# Patient Record
Sex: Female | Born: 1980
Health system: Southern US, Community
[De-identification: ages and names within clinical notes are randomized; demographics above are authoritative.]

## PROBLEM LIST (undated history)

## (undated) ENCOUNTER — Emergency Department (HOSPITAL_BASED_OUTPATIENT_CLINIC_OR_DEPARTMENT_OTHER): Admission: EM | Payer: 59

## (undated) ENCOUNTER — Inpatient Hospital Stay (HOSPITAL_COMMUNITY): Payer: Self-pay

## (undated) DIAGNOSIS — Z8619 Personal history of other infectious and parasitic diseases: Secondary | ICD-10-CM

## (undated) DIAGNOSIS — O34219 Maternal care for unspecified type scar from previous cesarean delivery: Secondary | ICD-10-CM

## (undated) DIAGNOSIS — T7840XA Allergy, unspecified, initial encounter: Secondary | ICD-10-CM

## (undated) HISTORY — DX: Personal history of other infectious and parasitic diseases: Z86.19

## (undated) HISTORY — DX: Allergy, unspecified, initial encounter: T78.40XA

---

## 2012-05-19 ENCOUNTER — Encounter: Payer: Medicaid Other | Admitting: Medical

## 2012-06-03 ENCOUNTER — Ambulatory Visit: Payer: Medicaid Other | Attending: Family Medicine | Admitting: Family Medicine

## 2012-06-03 VITALS — BP 98/66 | HR 59 | Temp 97.9°F | Resp 18 | Ht 65.0 in | Wt 147.0 lb

## 2012-06-03 DIAGNOSIS — L659 Nonscarring hair loss, unspecified: Secondary | ICD-10-CM | POA: Insufficient documentation

## 2012-06-03 DIAGNOSIS — J309 Allergic rhinitis, unspecified: Secondary | ICD-10-CM

## 2012-06-03 MED ORDER — LORATADINE 10 MG PO TABS: 10.0000 mg | ORAL_TABLET | Freq: Every day | ORAL | Status: DC

## 2012-06-03 MED ORDER — FLUTICASONE PROPIONATE 50 MCG/ACT NA SUSP: 2.0000 | Freq: Every day | NASAL | Status: DC

## 2012-06-03 NOTE — Progress Notes (Signed)
Patient states that she has been loosing a lot of hair while in the shower. Denies fever/chills, nausea. States maternal history of Thyroid disease.

## 2012-06-03 NOTE — Patient Instructions (Addendum)
Alopecia Areata Alopecia areata is a self-destructing (autoimmune) disease that results in the loss of hair. In this condition your body's immune system attacks the hair follicle. The hair follicle is responsible for growing hair. Hair loss can occur on the scalp and other parts of the body. It usually starts as one or more small, round, smooth patches of hair loss. It occurs in males and females of all ages and races, but usually starts before age 30. The scalp is the most commonly affected area, but the beard or any hair-bearing site can be affected. This type of hair loss does not leave scars where the hair was lost.  Many people with alopecia areata only have a few patches of hair loss. In others, extensive patchy hair loss occurs. In a few people, all scalp hair is lost (alopecia totalis), or hair is lost from the entire scalp and body (alopecia universalis). No matter how widespread the hair loss, the hair follicles remain alive and are ready to resume normal hair production whenever they receive the correct signal. Hair re-growth may occur without treatment and can even restart after years of hair loss.  CAUSES  It is thought that something triggers the immune system to stop hair growth. It is not always known what the cause is. Some people have genetic markers that can increase the chance of developing alopecia areata. Alopecia areata often occurs in families whose members have had:  Asthma.  Hay fever.  Atopic eczema.  Some autoimmune diseases may also be a trigger, such as:  Thyroid disease.  Diabetes.  Rheumatoid arthritis.  Lupus erythematosus.  Vitiligo.  Pernicious anemia.  Odom's disease. OTHER SYMPTOMS In some people, the nail beds may develop rows of tiny dents (stippling) or the nail beds can become distorted. Other than the hair and nail beds, no other body part is affected.  PROGNOSIS  Alopecia areata is not medically disabling. People with alopecia areata are  usually in excellent health. Hair loss can be emotionally difficult. The National Alopecia Areata Foundation has resources available to help individuals and families with alopecia areata. Their goal is to help people with the condition live full, productive lives. There are many successful, well-adjusted, contented people living with Alopecia areata. Alopecia areata can be overcome with:  A positive self image.  Sound medical facts.  The support of others, such as:  Sometimes professional counseling is helpful to develop one's self-confidence and positive self-image. TREATMENT  There is no cure for alopecia areata. There are several available treatments. Treatments are most effective in milder cases. No treatment is effective for everyone. Choice of treatment depends mainly on a person's age and the extent of their hair loss. Alopecia areata occurs in two forms:   A mild patchy form where less than 50 percent of scalp hair is lost.  An extensive form where greater than 50 percent of scalp hair is lost. These two forms of alopecia areata behave quite differently, and the choice of treatment depends on which form is present. Current treatments do not turn alopecia areata off. They can stimulate the hair follicle to produce hair.  Some medications used to treat mild cases include:  Cortisone injections. The most common treatment is the injection of cortisone into the bare skin patches. The injections are usually given by a caregiver specializing in skin issues (dermatologist). This caregiver will use a tiny needle to give multiple injections into the skin in and around the bare patches. The injections are repeated once a month.   If new hair growth occurs, it is usually visible within 4 weeks. Treatment does not prevent new patches of hair loss from developing. There are few side effects from local cortisone injections. Occasionally, temporary dents (depressions) in the skin result from the local  injections, but these dents can fill in by themselves.  Topical minoxidil. Five percent topical minoxidil solution applied twice daily may grow hair in alopecia areata. Scalp, eyebrows, and beard hair may respond. If scalp hair re-grows completely, treatment can be stopped. Response may improve if topical cortisone cream is applied 30 minutes after the minoxidil. Topical minoxidil is safe, easy to use, and does not lower blood pressure in persons with normal blood pressure. Minoxidil can lead to unwanted facial hair growth in some people.  Anthralin cream or ointment. Another treatment is the application of anthralin cream or ointment. Anthralin is a tar-like substance that has been used widely for psoriasis. Anthralin is applied to the bare patches once daily. It is washed off after a short time, usually 30 to 60 minutes later. If new hair growth occurs, it is seen in 8 to 12 weeks. Anthralin can be irritating to the skin. It can cause temporary, brownish discoloration of the treated skin. By using short treatment times, skin irritation and skin staining are reduced without decreasing effectiveness. Care must be taken not to get anthralin in the eyes. Some of the medications used for more extensive cases where there is greater than 50% hair loss include:  Cortisone pills. Cortisone pills are sometimes given for extensive scalp hair loss. Cortisone taken internally is much stronger than local injections of cortisone into the skin. It is necessary to discuss possible side effects of cortisone pills with your caregiver. In general, however, cortisone pills are used in relatively few patients with alopecia areata due to health risks from prolonged use. Also, hair that has grown is likely to fall out when the cortisone pills are stopped.  Topical minoxidil. See previous explanation under mild, patchy alopecia areata. However, minoxidil is not effective in total loss of scalp hair (alopecia totalis).  Topical  immunotherapy. Another method of treating alopecia areata or alopecia totalis/universalis involves producing an allergic rash or allergic contact dermatitis. Chemicals such as diphencyprone (DPCP) or squaric acid dibutyl ester (SADBE) are applied to the scalp to produce an allergic rash which resembles poison oak or ivy. Approximately 40% of patients treated with topical immunotherapy will re-grow scalp hair after about 6 months of treatment. Those who do successfully re-grow scalp hair will need to continue treatment to maintain hair re-growth.  Wigs. For extensive hair loss, a wig can be an important option for some people. Proper attention will make a quality wig look completely natural. A wig will need to be cut, thinned, and styled. To keep a net base wig from falling off, special double-sided tape can be purchased in beauty supply outlets and fastened to the inside of the wig.  For those with completely bare heads, there are suction caps to which any wig can be attached. There are also entire suction cap wig units. FOR MORE INFORMATION National Alopecia Areata Foundation: [email or web address] Document Released: 08/10/2003 Document Revised: 03/30/2011 Document Reviewed: 03/13/2008 [facility] Patient Information 2013 [place], [place].  Allergic Rhinitis Allergic rhinitis is when the mucous membranes in the nose respond to allergens. Allergens are particles in the air that cause your body to have an allergic reaction. This causes you to release allergic antibodies. Through a chain of events, these eventually cause you to release  histamine into the blood stream (hence the use of antihistamines). Although meant to be protective to the body, it is this release that causes your discomfort, such as frequent sneezing, congestion and an itchy runny nose.  CAUSES  The pollen allergens may come from grasses, trees, and weeds. This is seasonal allergic rhinitis, or "hay fever." Other allergens cause year-round allergic  rhinitis (perennial allergic rhinitis) such as house dust mite allergen, pet dander and mold spores.  SYMPTOMS   Nasal stuffiness (congestion).  Runny, itchy nose with sneezing and tearing of the eyes.  There is often an itching of the mouth, eyes and ears. It cannot be cured, but it can be controlled with medications. DIAGNOSIS  If you are unable to determine the offending allergen, skin or blood testing may find it. TREATMENT   Avoid the allergen.  Medications and allergy shots (immunotherapy) can help.  Hay fever may often be treated with antihistamines in pill or nasal spray forms. Antihistamines block the effects of histamine. There are over-the-counter medicines that may help with nasal congestion and swelling around the eyes. Check with your caregiver before taking or giving this medicine. If the treatment above does not work, there are many new medications your caregiver can prescribe. Stronger medications may be used if initial measures are ineffective. Desensitizing injections can be used if medications and avoidance fails. Desensitization is when a patient is given ongoing shots until the body becomes less sensitive to the allergen. Make sure you follow up with your caregiver if problems continue. SEEK MEDICAL CARE IF:   You develop fever (more than 100.5 F (38.1 C).  You develop a cough that does not stop easily (persistent).  You have shortness of breath.  You start wheezing.  Symptoms interfere with normal daily activities. Document Released: 09/30/2000 Document Revised: 03/30/2011 Document Reviewed: 04/11/2008 [facility] Patient Information 2013 [place], [place].

## 2012-06-03 NOTE — Progress Notes (Signed)
Patient here to establish care Complains of not beeing able to breather through nose at night time Has been having some hair loss

## 2012-06-03 NOTE — Progress Notes (Signed)
Patient ID: Kelly Harvey, female   DOB: 1980/09/11, 32 y.o.   MRN: [id number] CC: alopecia and nasal congestion  Translator used to communicate with patient.  HPI: Pt complaining of nasal allergies and sneezing worse in the spring time.  She is also concerned about some mild hair loss that she has noticed.    No Known Allergies No past medical history on file. No current outpatient prescriptions on file prior to visit.   No current facility-administered medications on file prior to visit.   No family history on file. History   Social History  . Marital Status: Married    Spouse Name: N/A    Number of Children: N/A  . Years of Education: N/A   Occupational History  . Not on file.   Social History Main Topics  . Smoking status: Never Smoker   . Smokeless tobacco: Not on file  . Alcohol Use: Not on file  . Drug Use: Not on file  . Sexually Active: Not on file   Other Topics Concern  . Not on file   Social History Narrative  . No narrative on file    Review of Systems  Constitutional: Negative for fever, chills, diaphoresis, activity change, appetite change and fatigue.  HENT: nasal congestion, sneezng and watery eyes   Eyes: Negative for pain, discharge, redness, itching and visual disturbance.  Respiratory: Negative for cough, choking, chest tightness, shortness of breath, wheezing and stridor.   Cardiovascular: Negative for chest pain, palpitations and leg swelling.  Gastrointestinal: Negative for abdominal distention.  Genitourinary: Negative for dysuria, urgency, frequency, hematuria, flank pain, decreased urine volume, difficulty urinating and dyspareunia.  Musculoskeletal: Negative for back pain, joint swelling, arthralgias and gait problem.  Neurological: Negative for dizziness, tremors, seizures, syncope, facial asymmetry, speech difficulty, weakness, light-headedness, numbness and headaches.  Hematological: Negative for adenopathy. Does not bruise/bleed easily.   Psychiatric/Behavioral: Negative for hallucinations, behavioral problems, confusion, dysphoric mood, decreased concentration and agitation.    Objective:   Filed Vitals:   06/03/12 1024  BP: 98/66  Pulse: 59  Temp: 97.9 F (36.6 C)  Resp: 18    Physical Exam  Constitutional: Appears well-developed and well-nourished. No distress. Pt wearing her traditional head coverings HENT: Normocephalic. External right and left ear normal. Oropharynx is clear and moist.  Eyes: Conjunctivae and EOM are normal. PERRLA, no scleral icterus.  Neck: Normal ROM. Neck supple. No JVD. No tracheal deviation. No thyromegaly.  CVS: RRR, S1/S2 +, no murmurs, no gallops, no carotid bruit.  Pulmonary: Effort and breath sounds normal, no stridor, rhonchi, wheezes, rales.  Abdominal: Soft. BS +,  no distension, tenderness, rebound or guarding.  Musculoskeletal: Normal range of motion. No edema and no tenderness.  Lymphadenopathy: No lymphadenopathy noted, cervical, inguinal. Neuro: Alert. Normal reflexes, muscle tone coordination. No cranial nerve deficit. Skin: Skin is warm and dry. No rash noted. Not diaphoretic. No erythema. No pallor.  Psychiatric: Normal mood and affect. Behavior, judgment, thought content normal.   No results found for this basename: WBC, HGB, HCT, MCV, PLT   No results found for this basename: CREATININE, BUN, NA, K, CL, CO2    No results found for this basename: HGBA1C        Assessment:  Allergic rhinitis  Alopecia        Plan:     claritin 10 mg po daily flonase NS daily Return if no improvement  Follow up for eval of her alopecia and to have TSH tested  Rodney Langton, MD,  CDE, FAAFP [facility] [place], [place]

## 2012-06-16 ENCOUNTER — Ambulatory Visit: Payer: Medicaid Other | Attending: Family Medicine | Admitting: Internal Medicine

## 2012-06-16 VITALS — BP 120/76 | HR 58 | Temp 98.7°F | Resp 16 | Ht 65.0 in | Wt 148.0 lb

## 2012-06-16 DIAGNOSIS — L659 Nonscarring hair loss, unspecified: Secondary | ICD-10-CM

## 2012-06-16 DIAGNOSIS — N979 Female infertility, unspecified: Secondary | ICD-10-CM | POA: Insufficient documentation

## 2012-06-16 NOTE — Progress Notes (Signed)
Patient states she would like a referral to [facility], ph# [phone] with Dr. Janae Sauce if possible.

## 2012-06-16 NOTE — Progress Notes (Signed)
Patient ID: Shauniece Harvey, female   DOB: Jun 29, 1980, 32 y.o.   MRN: [id number] Patient Demographics  Kelly Harvey, is a 32 y.o. female  ZHY:[id number]  [id number]:[id number]  DOB - 10/02/1980  Chief Complaint  Patient presents with  . Follow-up    To review lab results from last visit; patient states hair is falling out.        Subjective:   Kelly Harvey today is here for a follow up visit. She claims that she is losing hair, however has no patches of hair loss. She is also treated seeking treatment for infertility-Has been trying to get pregnant for the past 5 years.  Patient has No headache, No chest pain, No abdominal pain - No Nausea, No new weakness tingling or numbness, No Cough - SOB.   Objective:    Filed Vitals:   06/16/12 1602  BP: 120/76  Pulse: 58  Temp: 98.7 F (37.1 C)  TempSrc: Oral  Resp: 16  Height: 5\' 5"  (1.651 m)  Weight: 148 lb (67.132 kg)  SpO2: 99%     ALLERGIES:  No Known Allergies  PAST MEDICAL HISTORY: Past Medical History  Diagnosis Date  . Allergy     MEDICATIONS AT HOME: Prior to Admission medications   Medication Sig Start Date End Date Taking? Authorizing Provider  fluticasone (FLONASE) 50 MCG/ACT nasal spray Place 2 sprays into the nose daily. 06/03/12  Yes Kelly Cyndie Mull, MD  loratadine (CLARITIN) 10 MG tablet Take 1 tablet (10 mg total) by mouth daily. 06/03/12  Yes Kelly Cyndie Mull, MD     Exam  General appearance :Awake, alert, not in any distress. Speech Clear. Not toxic Looking HEENT: Atraumatic and Normocephalic, pupils equally reactive to light and accomodation Neck: supple, no JVD. No cervical lymphadenopathy.  Chest:Good air entry bilaterally, no added sounds  CVS: S1 S2 regular, no murmurs.  Abdomen: Bowel sounds present, Non tender and not distended with no gaurding, rigidity or rebound. Extremities: B/L Lower Ext shows no edema, both legs are warm to touch Neurology: Awake alert, and oriented X 3, CN II-XII intact, Non  focal Skin:No Rash Wounds:N/A    Data Review   CBC No results found for this basename: WBC, HGB, HCT, PLT, MCV, MCH, MCHC, RDW, NEUTRABS, LYMPHSABS, MONOABS, EOSABS, BASOSABS, BANDABS, BANDSABD,  in the last 168 hours  Chemistries   No results found for this basename: NA, K, CL, CO2, GLUCOSE, BUN, CREATININE, GFRCGP, CALCIUM, MG, AST, ALT, ALKPHOS, BILITOT,  in the last 168 hours ------------------------------------------------------------------------------------------------------------------ No results found for this basename: HGBA1C,  in the last 72 hours ------------------------------------------------------------------------------------------------------------------ No results found for this basename: CHOL, HDL, LDLCALC, TRIG, CHOLHDL, LDLDIRECT,  in the last 72 hours ------------------------------------------------------------------------------------------------------------------ No results found for this basename: TSH, T4TOTAL, FREET3, T3FREE, THYROIDAB,  in the last 72 hours ------------------------------------------------------------------------------------------------------------------ No results found for this basename: VITAMINB12, FOLATE, FERRITIN, TIBC, IRON, RETICCTPCT,  in the last 72 hours  Coagulation profile  No results found for this basename: INR, PROTIME,  in the last 168 hours    Assessment & Plan   Alopecia- Will check TSH, however feel that this is going to be negative. And she is going to be referred to [facility]- included a day he woke up which will include TSH testing   I explained the above to the patient, at this time she chooses to have her TSH tested at her appointment with her OB  Followup as needed

## 2013-03-13 ENCOUNTER — Ambulatory Visit (INDEPENDENT_AMBULATORY_CARE_PROVIDER_SITE_OTHER): Payer: BC Managed Care – PPO | Admitting: General Surgery

## 2013-03-13 ENCOUNTER — Encounter (INDEPENDENT_AMBULATORY_CARE_PROVIDER_SITE_OTHER): Payer: Self-pay | Admitting: General Surgery

## 2013-03-13 ENCOUNTER — Encounter (INDEPENDENT_AMBULATORY_CARE_PROVIDER_SITE_OTHER): Payer: Self-pay

## 2013-03-13 VITALS — BP 110/72 | HR 64 | Temp 98.2°F | Resp 14 | Ht 64.0 in | Wt 147.6 lb

## 2013-03-13 DIAGNOSIS — N6082 Other benign mammary dysplasias of left breast: Secondary | ICD-10-CM | POA: Insufficient documentation

## 2013-03-13 DIAGNOSIS — N6089 Other benign mammary dysplasias of unspecified breast: Secondary | ICD-10-CM

## 2013-03-13 NOTE — Patient Instructions (Signed)
Will get u/s of left breast  Shower daily with antibacterial soap

## 2013-03-13 NOTE — Progress Notes (Signed)
Patient ID: Kelly Harvey, female   DOB: 03/16/1980, 33 y.o.   MRN: [id number]  Chief Complaint  Patient presents with  . New Evaluation    eval seb cyst left br    HPI Kelly Harvey is a 33 y.o. female.  We're asked to see the patient in consultation by Dr. Melba Coon To evaluate her for a cyst on the left breast. The patient developed a lump in the upper inner left breast about 4 months ago. About 2 months ago she squeezed it and got a white oily substance out. The area became larger and red. She could warm compresses on it and squeezed it more and got more of the white we'll allow. Since that time it has pretty much disappeared. She denies any breast pain. She denies any fevers or chills. She's had no discharge from her nipple. HPI  Past Medical History  Diagnosis Date  . Allergy     History reviewed. No pertinent past surgical history.  History reviewed. No pertinent family history.  Social History History  Substance Use Topics  . Smoking status: Never Smoker   . Smokeless tobacco: Former Systems developer  . Alcohol Use: No    No Known Allergies  No current outpatient prescriptions on file.   No current facility-administered medications for this visit.    Review of Systems Review of Systems  Constitutional: Negative.   HENT: Negative.   Eyes: Negative.   Respiratory: Negative.   Cardiovascular: Negative.   Gastrointestinal: Negative.   Endocrine: Negative.   Genitourinary: Negative.   Musculoskeletal: Negative.   Skin: Negative.   Allergic/Immunologic: Negative.   Neurological: Negative.   Hematological: Negative.   Psychiatric/Behavioral: Negative.     Blood pressure 110/72, pulse 64, temperature 98.2 F (36.8 C), temperature source Oral, resp. rate 14, height 5\' 4"  (1.626 m), weight 147 lb 9.6 oz (66.951 kg).  Physical Exam Physical Exam  Constitutional: She is oriented to person, place, and time. She appears well-developed and well-nourished.  HENT:  Head: Normocephalic  and atraumatic.  Eyes: Conjunctivae and EOM are normal. Pupils are equal, round, and reactive to light.  Neck: Normal range of motion. Neck supple.  Cardiovascular: Normal rate, regular rhythm and normal heart sounds.   Pulmonary/Chest: Effort normal and breath sounds normal.  There is no palpable mass in either breast. There is no palpable axillary, supraclavicular, or cervical lymphadenopathy. There is only some very faint discoloration of the skin in the upper inner left breast  Abdominal: Soft. Bowel sounds are normal.  Musculoskeletal: Normal range of motion.  Lymphadenopathy:    She has no cervical adenopathy.  Neurological: She is alert and oriented to person, place, and time.  Skin: Skin is warm and dry.  Psychiatric: She has a normal mood and affect. Her behavior is normal.    Data Reviewed As above  Assessment    The patient appears to have a sebaceous cyst in the upper inner left breast that resolved once she was able to express the gland. For completeness I think it would  be reasonable to get an ultrasound of the left breast to make sure we are not missing anything. If this is negative then I think she can follow up as needed.     Plan    Plan for a left breast ultrasound and if negative when necessary followup        TOTH III,PAUL S 03/13/2013, 10:45 AM

## 2013-03-15 ENCOUNTER — Other Ambulatory Visit (INDEPENDENT_AMBULATORY_CARE_PROVIDER_SITE_OTHER): Payer: Self-pay | Admitting: General Surgery

## 2013-03-15 DIAGNOSIS — N6310 Unspecified lump in the right breast, unspecified quadrant: Secondary | ICD-10-CM

## 2013-03-24 ENCOUNTER — Other Ambulatory Visit: Payer: Self-pay | Admitting: *Deleted

## 2013-03-28 ENCOUNTER — Other Ambulatory Visit: Payer: Medicaid Other

## 2014-01-07 ENCOUNTER — Emergency Department (HOSPITAL_COMMUNITY)
Admission: EM | Admit: 2014-01-07 | Discharge: 2014-01-07 | Disposition: A | Discharge status: Home / Self Care | Payer: No Typology Code available for payment source | Attending: Emergency Medicine | Admitting: Emergency Medicine

## 2014-01-07 ENCOUNTER — Emergency Department (HOSPITAL_COMMUNITY): Payer: No Typology Code available for payment source

## 2014-01-07 ENCOUNTER — Encounter (HOSPITAL_COMMUNITY): Payer: Self-pay | Admitting: Emergency Medicine

## 2014-01-07 DIAGNOSIS — S299XXA Unspecified injury of thorax, initial encounter: Secondary | ICD-10-CM | POA: Insufficient documentation

## 2014-01-07 DIAGNOSIS — S0990XA Unspecified injury of head, initial encounter: Secondary | ICD-10-CM | POA: Diagnosis not present

## 2014-01-07 DIAGNOSIS — Y9259 Other trade areas as the place of occurrence of the external cause: Secondary | ICD-10-CM | POA: Diagnosis not present

## 2014-01-07 DIAGNOSIS — Y998 Other external cause status: Secondary | ICD-10-CM | POA: Diagnosis not present

## 2014-01-07 DIAGNOSIS — Y9389 Activity, other specified: Secondary | ICD-10-CM | POA: Diagnosis not present

## 2014-01-07 DIAGNOSIS — S199XXA Unspecified injury of neck, initial encounter: Secondary | ICD-10-CM | POA: Insufficient documentation

## 2014-01-07 DIAGNOSIS — S24109A Unspecified injury at unspecified level of thoracic spinal cord, initial encounter: Secondary | ICD-10-CM | POA: Diagnosis present

## 2014-01-07 DIAGNOSIS — R0789 Other chest pain: Secondary | ICD-10-CM

## 2014-01-07 DIAGNOSIS — M549 Dorsalgia, unspecified: Secondary | ICD-10-CM

## 2014-01-07 DIAGNOSIS — M542 Cervicalgia: Secondary | ICD-10-CM

## 2014-01-07 DIAGNOSIS — S4991XA Unspecified injury of right shoulder and upper arm, initial encounter: Secondary | ICD-10-CM | POA: Diagnosis not present

## 2014-01-07 MED ORDER — IBUPROFEN 200 MG PO TABS
600.0000 mg | ORAL_TABLET | Freq: Once | ORAL | Status: AC
  Administered 2014-01-07: 600 mg via ORAL
  Filled 2014-01-07: qty 3

## 2014-01-07 MED ORDER — IBUPROFEN 800 MG PO TABS: 800.0000 mg | ORAL_TABLET | Freq: Three times a day (TID) | ORAL | Status: DC | PRN

## 2014-01-07 MED ORDER — CYCLOBENZAPRINE HCL 10 MG PO TABS: 10.0000 mg | ORAL_TABLET | Freq: Three times a day (TID) | ORAL | Status: DC | PRN

## 2014-01-07 NOTE — Discharge Instructions (Signed)
Read the information below.  Use the prescribed medication as directed.  Please discuss all new medications with your pharmacist.  You may return to the Emergency Department at any time for worsening condition or any new symptoms that concern you.  If there is any possibility that you might be pregnant, please let your health care provider know and discuss this with the pharmacist to ensure medication safety.    You have had a head injury which does not appear to require admission at this time. A concussion is a state of changed mental ability from trauma. SEEK IMMEDIATE MEDICAL ATTENTION IF: There is confusion or drowsiness (although children frequently become drowsy after injury).  You cannot awaken the injured person.  There is nausea (feeling sick to your stomach) or continued, forceful vomiting.  You notice dizziness or unsteadiness which is getting worse, or inability to walk.  You have convulsions or unconsciousness.  You experience severe, persistent headaches not relieved by Tylenol?. (Do not take aspirin as this impairs clotting abilities). Take other pain medications only as directed.  You cannot use arms or legs normally.  There are changes in pupil sizes. (This is the black center in the colored part of the eye)  There is clear or bloody discharge from the nose or ears.  Change in speech, vision, swallowing, or understanding.  Localized weakness, numbness, tingling, or change in bowel or bladder control.
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
## 2016-01-27 LAB — OB RESULTS CONSOLE VARICELLA ZOSTER ANTIBODY, IGG: Varicella: IMMUNE

## 2016-01-27 LAB — OB RESULTS CONSOLE HEPATITIS B SURFACE ANTIGEN: Hepatitis B Surface Ag: NEGATIVE

## 2016-01-27 LAB — OB RESULTS CONSOLE RUBELLA ANTIBODY, IGM: RUBELLA: IMMUNE

## 2016-01-27 LAB — OB RESULTS CONSOLE ABO/RH: RH Type: POSITIVE

## 2016-01-27 LAB — OB RESULTS CONSOLE GC/CHLAMYDIA
Chlamydia: NEGATIVE
GC PROBE AMP, GENITAL: NEGATIVE

## 2016-01-27 LAB — OB RESULTS CONSOLE ANTIBODY SCREEN: Antibody Screen: NEGATIVE

## 2016-01-29 ENCOUNTER — Encounter: Payer: Medicaid Other | Admitting: Obstetrics & Gynecology

## 2016-05-08 LAB — OB RESULTS CONSOLE HIV ANTIBODY (ROUTINE TESTING): HIV: NONREACTIVE

## 2016-07-09 LAB — OB RESULTS CONSOLE GBS: STREP GROUP B AG: POSITIVE

## 2016-07-14 ENCOUNTER — Inpatient Hospital Stay (HOSPITAL_COMMUNITY)
Admission: AD | Admit: 2016-07-14 | Discharge: 2016-07-14 | Disposition: A | Discharge status: Home / Self Care | Payer: Medicaid Other | Source: Ambulatory Visit | Attending: Obstetrics and Gynecology | Admitting: Obstetrics and Gynecology

## 2016-07-14 ENCOUNTER — Encounter (HOSPITAL_COMMUNITY): Payer: Self-pay

## 2016-07-14 DIAGNOSIS — Z3A37 37 weeks gestation of pregnancy: Secondary | ICD-10-CM | POA: Diagnosis not present

## 2016-07-14 DIAGNOSIS — O479 False labor, unspecified: Secondary | ICD-10-CM

## 2016-07-14 DIAGNOSIS — Z3483 Encounter for supervision of other normal pregnancy, third trimester: Secondary | ICD-10-CM | POA: Diagnosis present

## 2016-07-14 NOTE — Progress Notes (Addendum)
G1P0 @ [redacted] wksga. Presents to triage for loss of mucus plug. Denies LOF, bleeding or ctx. +FM. EFM applied.   SVE checked yesterday 1-2cm.   2045: Provider notified. Report status of pt given. Orders received to discharge pt home.   2115: Dr. Terri Piedra notified for d/c orders.   2140: Discharge instructions given with pt understanding. Pt left unit ambulatory with her husband.

## 2016-07-14 NOTE — OB Triage Provider Note (Signed)
Pt sen in MAU with complaint of suspected labor due to loss of mucus plug.  Pt assessed and noted to not be in labor CAT 1 strip Ok to discharge to home F/u in office as scheduled

## 2016-07-19 ENCOUNTER — Encounter (HOSPITAL_COMMUNITY): Payer: Self-pay | Admitting: *Deleted

## 2016-07-19 ENCOUNTER — Inpatient Hospital Stay (HOSPITAL_COMMUNITY): Payer: Medicaid Other

## 2016-07-19 ENCOUNTER — Inpatient Hospital Stay (EMERGENCY_DEPARTMENT_HOSPITAL)
Admission: AD | Admit: 2016-07-19 | Discharge: 2016-07-20 | Disposition: A | Discharge status: Home / Self Care | Payer: Medicaid Other | Source: Ambulatory Visit | Attending: Obstetrics and Gynecology | Admitting: Obstetrics and Gynecology

## 2016-07-19 DIAGNOSIS — O368130 Decreased fetal movements, third trimester, not applicable or unspecified: Secondary | ICD-10-CM

## 2016-07-19 DIAGNOSIS — Z3A37 37 weeks gestation of pregnancy: Secondary | ICD-10-CM

## 2016-07-19 DIAGNOSIS — O368390 Maternal care for abnormalities of the fetal heart rate or rhythm, unspecified trimester, not applicable or unspecified: Secondary | ICD-10-CM

## 2016-07-19 MED ORDER — LACTATED RINGERS IV BOLUS (SEPSIS)
1000.0000 mL | Freq: Once | INTRAVENOUS | Status: AC
  Administered 2016-07-19: 1000 mL via INTRAVENOUS

## 2016-07-19 NOTE — MAU Note (Signed)
Decrease FM

## 2016-07-19 NOTE — MAU Provider Note (Signed)
History   [id number]   Chief Complaint  Patient presents with  . Decreased Fetal Movement    HPI Kelly Harvey is a 36 y.o. female  G1P0 here with report of decreased fetal movement since this morning at 930 am. Reports feeling no fetal movement since this morning. Denies vaginal bleeding or leaking of fluid. Denies abdominal trauma or fall. Denies abdominal pain or contractions.  No LMP recorded. Patient is pregnant.  OB History  Gravida Para Term Preterm AB Living  1            SAB TAB Ectopic Multiple Live Births               # Outcome Date GA Lbr Len/2nd Weight Sex Delivery Anes PTL Lv  1 Current               Past Medical History:  Diagnosis Date  . Allergy     History reviewed. No pertinent family history.  Social History   Social History  . Marital status: Married    Spouse name: N/A  . Number of children: N/A  . Years of education: N/A   Social History Main Topics  . Smoking status: Never Smoker  . Smokeless tobacco: Former Systems developer  . Alcohol use No  . Drug use: No  . Sexual activity: Not Asked   Other Topics Concern  . None   Social History Narrative  . None    No Known Allergies  No current facility-administered medications on file prior to encounter.    Current Outpatient Prescriptions on File Prior to Encounter  Medication Sig Dispense Refill  . cyclobenzaprine (FLEXERIL) 10 MG tablet Take 1 tablet (10 mg total) by mouth 3 (three) times daily as needed for muscle spasms (or pain). 15 tablet 0  . ibuprofen (ADVIL,MOTRIN) 800 MG tablet Take 1 tablet (800 mg total) by mouth every 8 (eight) hours as needed for mild pain or moderate pain. 15 tablet 0  . metFORMIN (GLUCOPHAGE) 1000 MG tablet Take 1,000 mg by mouth daily with lunch.       Review of Systems  Constitutional: Negative.   Gastrointestinal: Negative.   Genitourinary: Negative.      Physical Exam   Vitals:   07/19/16 2139 07/20/16 0024  BP: 116/70 116/66  Pulse: 79 65  Resp:  17 17  Temp: 97.8 F (36.6 C) 98 F (36.7 C)  TempSrc: Oral Oral    Physical Exam  Nursing note and vitals reviewed. Constitutional: She is oriented to person, place, and time. She appears well-developed and well-nourished. No distress.  HENT:  Head: Normocephalic and atraumatic.  Eyes: Conjunctivae are normal. Right eye exhibits no discharge. Left eye exhibits no discharge. No scleral icterus.  Neck: Normal range of motion.  Respiratory: Effort normal and breath sounds normal. No respiratory distress.  GI: Soft. There is no tenderness.  Neurological: She is alert and oriented to person, place, and time.  Skin: Skin is warm and dry. She is not diaphoretic.  Psychiatric: She has a normal mood and affect. Her behavior is normal. Judgment and thought content normal.   Fetal Tracing:  Baseline: 145 Variability: min to mod Accelerations: 10x10 Decelerations: prolonged (just at 2 min)  Toco: none MAU Course  Procedures No results found for this or any previous visit (from the past 24 hour(s)).  MDM Nonreactive NST with fetal decel BPP 8/8 with normal AFI Patient reports return of fetal movement since ultrasound Dr. Terri Piedra notified of fetal tracing, fetal deceleration, &  BPP. Patient has appointment in office tomorrow. Ok to discharge home. Reassured by BPP & return of fetal movement.   Assessment and Plan  A: 1. Fetal heart rate decelerations affecting management of mother   2. [redacted] weeks gestation of pregnancy   3. Decreased fetal movement affecting management of pregnancy in third trimester, single or unspecified fetus    P: Discharge home Fetal kick counts form Discussed reasons to return to MAU  Keep f/u with OB tomorrow   Jorje Guild, NP 07/19/2016 10:04 PM

## 2016-07-20 ENCOUNTER — Inpatient Hospital Stay (HOSPITAL_COMMUNITY): Payer: Medicaid Other | Admitting: Anesthesiology

## 2016-07-20 ENCOUNTER — Encounter (HOSPITAL_COMMUNITY): Payer: Self-pay | Admitting: *Deleted

## 2016-07-20 ENCOUNTER — Inpatient Hospital Stay (HOSPITAL_COMMUNITY)
Admission: AD | Admit: 2016-07-20 | Discharge: 2016-07-24 | DRG: 766 | Disposition: A | Discharge status: Home / Self Care | Payer: Medicaid Other | Source: Ambulatory Visit | Attending: Obstetrics and Gynecology | Admitting: Obstetrics and Gynecology

## 2016-07-20 ENCOUNTER — Encounter (HOSPITAL_COMMUNITY)
Admission: AD | Disposition: A | Discharge status: Home / Self Care | Payer: Self-pay | Source: Ambulatory Visit | Attending: Obstetrics and Gynecology

## 2016-07-20 DIAGNOSIS — Z3A37 37 weeks gestation of pregnancy: Secondary | ICD-10-CM | POA: Diagnosis not present

## 2016-07-20 DIAGNOSIS — O368390 Maternal care for abnormalities of the fetal heart rate or rhythm, unspecified trimester, not applicable or unspecified: Secondary | ICD-10-CM | POA: Diagnosis not present

## 2016-07-20 DIAGNOSIS — O99824 Streptococcus B carrier state complicating childbirth: Secondary | ICD-10-CM | POA: Diagnosis present

## 2016-07-20 DIAGNOSIS — Z98891 History of uterine scar from previous surgery: Secondary | ICD-10-CM

## 2016-07-20 DIAGNOSIS — Z3493 Encounter for supervision of normal pregnancy, unspecified, third trimester: Secondary | ICD-10-CM | POA: Diagnosis present

## 2016-07-20 LAB — CBC
HEMATOCRIT: 29.2 % — AB (ref 36.0–46.0)
HEMOGLOBIN: 9.9 g/dL — AB (ref 12.0–15.0)
MCH: 29.9 pg (ref 26.0–34.0)
MCHC: 33.9 g/dL (ref 30.0–36.0)
MCV: 88.2 fL (ref 78.0–100.0)
Platelets: 246 10*3/uL (ref 150–400)
RBC: 3.31 MIL/uL — ABNORMAL LOW (ref 3.87–5.11)
RDW: 13.1 % (ref 11.5–15.5)
WBC: 23.2 10*3/uL — ABNORMAL HIGH (ref 4.0–10.5)

## 2016-07-20 LAB — TYPE AND SCREEN
ABO/RH(D): A POS
Antibody Screen: NEGATIVE

## 2016-07-20 LAB — ABO/RH: ABO/RH(D): A POS

## 2016-07-20 SURGERY — Surgical Case
Anesthesia: Spinal

## 2016-07-20 MED ORDER — MEPERIDINE HCL 25 MG/ML IJ SOLN
6.2500 mg | INTRAMUSCULAR | Status: DC | PRN
  Administered 2016-07-20: 12.5 mg via INTRAVENOUS

## 2016-07-20 MED ORDER — MIDAZOLAM HCL 2 MG/2ML IJ SOLN
INTRAMUSCULAR | Status: DC | PRN
  Administered 2016-07-20: 2 mg via INTRAVENOUS

## 2016-07-20 MED ORDER — OXYTOCIN 40 UNITS IN LACTATED RINGERS INFUSION - SIMPLE MED: 2.5000 [IU]/h | INTRAVENOUS | Status: AC

## 2016-07-20 MED ORDER — MEPERIDINE HCL 25 MG/ML IJ SOLN
6.2500 mg | INTRAMUSCULAR | Status: DC | PRN
Start: 2016-07-20 — End: 2016-07-20

## 2016-07-20 MED ORDER — DEXAMETHASONE SODIUM PHOSPHATE 4 MG/ML IJ SOLN
INTRAMUSCULAR | Status: AC
  Filled 2016-07-20: qty 1

## 2016-07-20 MED ORDER — KETOROLAC TROMETHAMINE 30 MG/ML IJ SOLN
30.0000 mg | Freq: Four times a day (QID) | INTRAMUSCULAR | Status: AC | PRN
  Administered 2016-07-20: 30 mg via INTRAVENOUS

## 2016-07-20 MED ORDER — MEPERIDINE HCL 25 MG/ML IJ SOLN
INTRAMUSCULAR | Status: AC
  Administered 2016-07-20: 12.5 mg via INTRAVENOUS
  Filled 2016-07-20: qty 1

## 2016-07-20 MED ORDER — SUCCINYLCHOLINE CHLORIDE 200 MG/10ML IV SOSY
PREFILLED_SYRINGE | INTRAVENOUS | Status: AC
  Filled 2016-07-20: qty 10

## 2016-07-20 MED ORDER — ACETAMINOPHEN 500 MG PO TABS
1000.0000 mg | ORAL_TABLET | Freq: Four times a day (QID) | ORAL | Status: AC
  Administered 2016-07-21 (×2): 1000 mg via ORAL
  Filled 2016-07-20 (×2): qty 2

## 2016-07-20 MED ORDER — LACTATED RINGERS IV SOLN
INTRAVENOUS | Status: DC
  Administered 2016-07-20 (×2): via INTRAVENOUS

## 2016-07-20 MED ORDER — OXYTOCIN 10 UNIT/ML IJ SOLN
INTRAMUSCULAR | Status: DC | PRN
  Administered 2016-07-20: 40 [IU] via INTRAMUSCULAR

## 2016-07-20 MED ORDER — SENNOSIDES-DOCUSATE SODIUM 8.6-50 MG PO TABS
2.0000 | ORAL_TABLET | ORAL | Status: DC
  Administered 2016-07-21 (×2): 2 via ORAL
  Filled 2016-07-20 (×3): qty 2

## 2016-07-20 MED ORDER — KETOROLAC TROMETHAMINE 30 MG/ML IJ SOLN: 30.0000 mg | Freq: Four times a day (QID) | INTRAMUSCULAR | Status: AC | PRN

## 2016-07-20 MED ORDER — MIDAZOLAM HCL 2 MG/2ML IJ SOLN
INTRAMUSCULAR | Status: AC
  Filled 2016-07-20: qty 2

## 2016-07-20 MED ORDER — FENTANYL CITRATE (PF) 100 MCG/2ML IJ SOLN
INTRAMUSCULAR | Status: AC
  Filled 2016-07-20: qty 2

## 2016-07-20 MED ORDER — IBUPROFEN 600 MG PO TABS: 600.0000 mg | ORAL_TABLET | Freq: Four times a day (QID) | ORAL | Status: DC | PRN

## 2016-07-20 MED ORDER — OXYCODONE HCL 5 MG PO TABS: 10.0000 mg | ORAL_TABLET | ORAL | Status: DC | PRN

## 2016-07-20 MED ORDER — NALOXONE HCL 2 MG/2ML IJ SOSY
1.0000 ug/kg/h | PREFILLED_SYRINGE | INTRAVENOUS | Status: DC | PRN
  Filled 2016-07-20: qty 2

## 2016-07-20 MED ORDER — ONDANSETRON HCL 4 MG/2ML IJ SOLN: 4.0000 mg | Freq: Three times a day (TID) | INTRAMUSCULAR | Status: DC | PRN

## 2016-07-20 MED ORDER — DIPHENHYDRAMINE HCL 25 MG PO CAPS: 25.0000 mg | ORAL_CAPSULE | Freq: Four times a day (QID) | ORAL | Status: DC | PRN

## 2016-07-20 MED ORDER — SODIUM CHLORIDE 0.9% FLUSH: 9.0000 mL | INTRAVENOUS | Status: DC | PRN

## 2016-07-20 MED ORDER — DIPHENHYDRAMINE HCL 25 MG PO CAPS
25.0000 mg | ORAL_CAPSULE | ORAL | Status: DC | PRN
Start: 2016-07-20 — End: 2016-07-24

## 2016-07-20 MED ORDER — CEFAZOLIN SODIUM-DEXTROSE 2-4 GM/100ML-% IV SOLN
INTRAVENOUS | Status: AC
  Filled 2016-07-20: qty 100

## 2016-07-20 MED ORDER — ACETAMINOPHEN 325 MG PO TABS: 650.0000 mg | ORAL_TABLET | ORAL | Status: DC | PRN

## 2016-07-20 MED ORDER — PROPOFOL 10 MG/ML IV BOLUS
INTRAVENOUS | Status: DC | PRN
  Administered 2016-07-20: 200 mg via INTRAVENOUS

## 2016-07-20 MED ORDER — SCOPOLAMINE 1 MG/3DAYS TD PT72
MEDICATED_PATCH | TRANSDERMAL | Status: DC | PRN
  Administered 2016-07-20: 1 via TRANSDERMAL

## 2016-07-20 MED ORDER — PRENATAL MULTIVITAMIN CH
1.0000 | ORAL_TABLET | Freq: Every day | ORAL | Status: DC
  Administered 2016-07-22 – 2016-07-23 (×2): 1 via ORAL
  Filled 2016-07-20 (×2): qty 1

## 2016-07-20 MED ORDER — CEFAZOLIN SODIUM-DEXTROSE 2-4 GM/100ML-% IV SOLN
2.0000 g | Freq: Once | INTRAVENOUS | Status: AC
  Administered 2016-07-21: 2 g via INTRAVENOUS
  Filled 2016-07-20: qty 100

## 2016-07-20 MED ORDER — METOCLOPRAMIDE HCL 5 MG/ML IJ SOLN: 10.0000 mg | Freq: Once | INTRAMUSCULAR | Status: DC | PRN

## 2016-07-20 MED ORDER — NALBUPHINE SYRINGE 5 MG/0.5 ML
5.0000 mg | INJECTION | Freq: Once | INTRAMUSCULAR | Status: DC | PRN
  Filled 2016-07-20: qty 0.5

## 2016-07-20 MED ORDER — FENTANYL CITRATE (PF) 100 MCG/2ML IJ SOLN: 25.0000 ug | INTRAMUSCULAR | Status: DC | PRN

## 2016-07-20 MED ORDER — DIPHENHYDRAMINE HCL 50 MG/ML IJ SOLN: 12.5000 mg | Freq: Four times a day (QID) | INTRAMUSCULAR | Status: DC | PRN

## 2016-07-20 MED ORDER — LACTATED RINGERS IV SOLN
INTRAVENOUS | Status: DC
  Administered 2016-07-21: 02:00:00 via INTRAVENOUS

## 2016-07-20 MED ORDER — SIMETHICONE 80 MG PO CHEW: 80.0000 mg | CHEWABLE_TABLET | ORAL | Status: DC | PRN

## 2016-07-20 MED ORDER — HYDROMORPHONE 1 MG/ML IV SOLN
INTRAVENOUS | Status: DC
  Administered 2016-07-20: 25 mg via INTRAVENOUS
  Administered 2016-07-21: 0.3 mg via INTRAVENOUS
  Filled 2016-07-20: qty 25

## 2016-07-20 MED ORDER — PROPOFOL 10 MG/ML IV BOLUS
INTRAVENOUS | Status: AC
  Filled 2016-07-20: qty 40

## 2016-07-20 MED ORDER — DIBUCAINE 1 % RE OINT: 1.0000 "application " | TOPICAL_OINTMENT | RECTAL | Status: DC | PRN

## 2016-07-20 MED ORDER — DIPHENHYDRAMINE HCL 50 MG/ML IJ SOLN: 12.5000 mg | INTRAMUSCULAR | Status: DC | PRN

## 2016-07-20 MED ORDER — LACTATED RINGERS IV BOLUS (SEPSIS): 1000.0000 mL | Freq: Once | INTRAVENOUS | Status: DC

## 2016-07-20 MED ORDER — CEFAZOLIN SODIUM-DEXTROSE 2-3 GM-% IV SOLR
INTRAVENOUS | Status: DC | PRN
  Administered 2016-07-20: 2 g via INTRAVENOUS

## 2016-07-20 MED ORDER — FENTANYL CITRATE (PF) 250 MCG/5ML IJ SOLN
INTRAMUSCULAR | Status: AC
  Filled 2016-07-20: qty 5

## 2016-07-20 MED ORDER — NALBUPHINE SYRINGE 5 MG/0.5 ML
5.0000 mg | INJECTION | INTRAMUSCULAR | Status: DC | PRN
  Filled 2016-07-20: qty 0.5

## 2016-07-20 MED ORDER — ZOLPIDEM TARTRATE 5 MG PO TABS: 5.0000 mg | ORAL_TABLET | Freq: Every evening | ORAL | Status: DC | PRN

## 2016-07-20 MED ORDER — IBUPROFEN 800 MG PO TABS
800.0000 mg | ORAL_TABLET | Freq: Three times a day (TID) | ORAL | Status: DC
  Administered 2016-07-21 – 2016-07-24 (×7): 800 mg via ORAL
  Filled 2016-07-20 (×9): qty 1

## 2016-07-20 MED ORDER — TETANUS-DIPHTH-ACELL PERTUSSIS 5-2.5-18.5 LF-MCG/0.5 IM SUSP: 0.5000 mL | Freq: Once | INTRAMUSCULAR | Status: DC

## 2016-07-20 MED ORDER — KETOROLAC TROMETHAMINE 30 MG/ML IJ SOLN
INTRAMUSCULAR | Status: AC
  Administered 2016-07-20: 30 mg via INTRAVENOUS
  Filled 2016-07-20: qty 1

## 2016-07-20 MED ORDER — SIMETHICONE 80 MG PO CHEW
80.0000 mg | CHEWABLE_TABLET | Freq: Three times a day (TID) | ORAL | Status: DC
  Administered 2016-07-22 – 2016-07-24 (×3): 80 mg via ORAL
  Filled 2016-07-20 (×3): qty 1

## 2016-07-20 MED ORDER — SIMETHICONE 80 MG PO CHEW
80.0000 mg | CHEWABLE_TABLET | ORAL | Status: DC
  Administered 2016-07-21 (×2): 80 mg via ORAL
  Filled 2016-07-20 (×3): qty 1

## 2016-07-20 MED ORDER — FENTANYL CITRATE (PF) 100 MCG/2ML IJ SOLN
INTRAMUSCULAR | Status: DC | PRN
  Administered 2016-07-20: 250 ug via INTRAVENOUS

## 2016-07-20 MED ORDER — DEXAMETHASONE SODIUM PHOSPHATE 10 MG/ML IJ SOLN
INTRAMUSCULAR | Status: DC | PRN
  Administered 2016-07-20: 4 mg via INTRAVENOUS

## 2016-07-20 MED ORDER — MENTHOL 3 MG MT LOZG: 1.0000 | LOZENGE | OROMUCOSAL | Status: DC | PRN

## 2016-07-20 MED ORDER — WITCH HAZEL-GLYCERIN EX PADS: 1.0000 "application " | MEDICATED_PAD | CUTANEOUS | Status: DC | PRN

## 2016-07-20 MED ORDER — NALOXONE HCL 0.4 MG/ML IJ SOLN: 0.4000 mg | INTRAMUSCULAR | Status: DC | PRN

## 2016-07-20 MED ORDER — ONDANSETRON HCL 4 MG/2ML IJ SOLN
INTRAMUSCULAR | Status: DC | PRN
  Administered 2016-07-20: 4 mg via INTRAVENOUS

## 2016-07-20 MED ORDER — DIPHENHYDRAMINE HCL 12.5 MG/5ML PO ELIX: 12.5000 mg | ORAL_SOLUTION | Freq: Four times a day (QID) | ORAL | Status: DC | PRN

## 2016-07-20 MED ORDER — OXYTOCIN 10 UNIT/ML IJ SOLN
INTRAMUSCULAR | Status: AC
  Filled 2016-07-20: qty 4

## 2016-07-20 MED ORDER — ONDANSETRON HCL 4 MG/2ML IJ SOLN: 4.0000 mg | Freq: Four times a day (QID) | INTRAMUSCULAR | Status: DC | PRN

## 2016-07-20 MED ORDER — COCONUT OIL OIL
1.0000 "application " | TOPICAL_OIL | Status: DC | PRN
  Filled 2016-07-20: qty 120

## 2016-07-20 MED ORDER — SUCCINYLCHOLINE CHLORIDE 20 MG/ML IJ SOLN
INTRAMUSCULAR | Status: DC | PRN
  Administered 2016-07-20: 140 mg via INTRAVENOUS

## 2016-07-20 MED ORDER — OXYCODONE HCL 5 MG PO TABS: 5.0000 mg | ORAL_TABLET | ORAL | Status: DC | PRN

## 2016-07-20 MED ORDER — SODIUM CHLORIDE 0.9% FLUSH: 3.0000 mL | INTRAVENOUS | Status: DC | PRN

## 2016-07-20 MED ORDER — ONDANSETRON HCL 4 MG/2ML IJ SOLN
INTRAMUSCULAR | Status: AC
  Filled 2016-07-20: qty 2

## 2016-07-20 SURGICAL SUPPLY — 37 items
BENZOIN TINCTURE PRP APPL 2/3 (GAUZE/BANDAGES/DRESSINGS) ×3 IMPLANT
CHLORAPREP W/TINT 26ML (MISCELLANEOUS) ×3 IMPLANT
CLAMP CORD UMBIL (MISCELLANEOUS) IMPLANT
CLOSURE WOUND 1/2 X4 (GAUZE/BANDAGES/DRESSINGS) ×1
CLOTH BEACON ORANGE TIMEOUT ST (SAFETY) ×3 IMPLANT
CONTAINER PREFILL 10% NBF 15ML (MISCELLANEOUS) IMPLANT
DRSG OPSITE POSTOP 4X10 (GAUZE/BANDAGES/DRESSINGS) ×3 IMPLANT
ELECT REM PT RETURN 9FT ADLT (ELECTROSURGICAL) ×3
ELECTRODE REM PT RTRN 9FT ADLT (ELECTROSURGICAL) ×1 IMPLANT
EXTRACTOR VACUUM M CUP 4 TUBE (SUCTIONS) IMPLANT
EXTRACTOR VACUUM M CUP 4' TUBE (SUCTIONS)
GLOVE BIO SURGEON STRL SZ 6.5 (GLOVE) ×2 IMPLANT
GLOVE BIO SURGEONS STRL SZ 6.5 (GLOVE) ×1
GLOVE BIOGEL PI IND STRL 7.0 (GLOVE) ×1 IMPLANT
GLOVE BIOGEL PI INDICATOR 7.0 (GLOVE) ×2
GOWN STRL REUS W/TWL LRG LVL3 (GOWN DISPOSABLE) ×6 IMPLANT
KIT ABG SYR 3ML LUER SLIP (SYRINGE) IMPLANT
NEEDLE HYPO 25X5/8 SAFETYGLIDE (NEEDLE) IMPLANT
NS IRRIG 1000ML POUR BTL (IV SOLUTION) ×3 IMPLANT
PACK C SECTION WH (CUSTOM PROCEDURE TRAY) ×3 IMPLANT
PAD OB MATERNITY 4.3X12.25 (PERSONAL CARE ITEMS) ×3 IMPLANT
PENCIL SMOKE EVAC W/HOLSTER (ELECTROSURGICAL) ×3 IMPLANT
RETAINER VISCERAL (MISCELLANEOUS) ×3 IMPLANT
RTRCTR C-SECT PINK 25CM LRG (MISCELLANEOUS) ×6 IMPLANT
STRIP CLOSURE SKIN 1/2X4 (GAUZE/BANDAGES/DRESSINGS) ×2 IMPLANT
SUT MNCRL 0 VIOLET CTX 36 (SUTURE) ×2 IMPLANT
SUT MONOCRYL 0 CTX 36 (SUTURE) ×4
SUT PLAIN 1 NONE 54 (SUTURE) IMPLANT
SUT PLAIN 2 0 XLH (SUTURE) ×3 IMPLANT
SUT VIC AB 0 CT1 27 (SUTURE) ×4
SUT VIC AB 0 CT1 27XBRD ANBCTR (SUTURE) ×2 IMPLANT
SUT VIC AB 2-0 CT1 27 (SUTURE) ×2
SUT VIC AB 2-0 CT1 TAPERPNT 27 (SUTURE) ×1 IMPLANT
SUT VIC AB 4-0 KS 27 (SUTURE) ×3 IMPLANT
SYR BULB IRRIGATION 50ML (SYRINGE) ×3 IMPLANT
TOWEL OR 17X24 6PK STRL BLUE (TOWEL DISPOSABLE) ×3 IMPLANT
TRAY FOLEY BAG SILVER LF 14FR (SET/KITS/TRAYS/PACK) ×3 IMPLANT

## 2016-07-20 NOTE — Transfer of Care (Signed)
Immediate Anesthesia Transfer of Care Note  Patient: Kelly Harvey  Procedure(s) Performed: Procedure(s): CESAREAN SECTION (N/A)  Patient Location: PACU  Anesthesia Type:General  Level of Consciousness: awake, alert  and oriented  Airway & Oxygen Therapy: Patient Spontanous Breathing and Patient connected to nasal cannula oxygen  Post-op Assessment: Report given to RN and Post -op Vital signs reviewed and stable  Post vital signs: Reviewed and stable  Last Vitals:  Vitals:   07/20/16 1738  BP: 131/79  Pulse: 69  Resp: 16  Temp: 36.5 C    Last Pain: There were no vitals filed for this visit.       Complications: No apparent anesthesia complications

## 2016-07-20 NOTE — Discharge Instructions (Signed)

## 2016-07-20 NOTE — H&P (Signed)
Kelly Harvey is a 36 y.o. female G2P0010 at 37+6 with category 2-3 strip.  Sent from office with concerning strip - 120-125 min variability, category 2-3.  ?occ ctx.  Pregnancy IVF preg by CFI.  Otherwise relatively uncomplicated.    OB History    Gravida Para Term Preterm AB Living   1             SAB TAB Ectopic Multiple Live Births                G1 SAB, chemical preg G2 present  Work up for IVF in [place] and [place] No abn pap, no STD  Past Medical History:  Diagnosis Date  . Allergy    History reviewed. No pertinent surgical history. Family History: family history is not on file. Social History:  reports that she has never smoked. She has quit using smokeless tobacco. She reports that she does not drink alcohol or use drugs.married Meds PNV All NKDA     Maternal Diabetes: No Genetic Screening: Normal Maternal Ultrasounds/Referrals: Normal Fetal Ultrasounds or other Referrals:  None Maternal Substance Abuse:  No Significant Maternal Medications:  None Significant Maternal Lab Results:  Lab values include: Group B Strep positive Other Comments:  None  Review of Systems  Constitutional: Negative.   HENT: Negative.   Eyes: Negative.   Respiratory: Negative.   Cardiovascular: Negative.   Gastrointestinal: Positive for abdominal pain.  Genitourinary: Negative.   Musculoskeletal: Negative.   Skin: Negative.   Neurological: Negative.   Psychiatric/Behavioral: Negative.    Maternal Medical History:  Fetal activity: Perceived fetal activity is decreased.    Prenatal Complications - Diabetes: none.    Dilation: 1 Effacement (%): 50 Station: -3 Exam by:: Dr Melba Coon Blood pressure 131/79, pulse 69, temperature 97.7 F (36.5 C), resp. rate 16, SpO2 100 %. Maternal Exam:  Abdomen: Patient reports no abdominal tenderness. Fundal height is appropriate for gestation.   Estimated fetal weight is 7#.   Fetal presentation: vertex  Introitus: Normal vulva. Normal vagina.   Cervix: Cervix evaluated by digital exam.     Physical Exam  Constitutional: She is oriented to person, place, and time. She appears well-developed and well-nourished.  HENT:  Head: Normocephalic and atraumatic.  Cardiovascular: Normal rate and regular rhythm.   Respiratory: Effort normal. No respiratory distress. She has no wheezes.  GI: Soft. Bowel sounds are normal. She exhibits no distension. There is no tenderness.  Musculoskeletal: Normal range of motion.  Neurological: She is alert and oriented to person, place, and time.  Skin: Skin is warm and dry.  Psychiatric: She has a normal mood and affect. Her behavior is normal.    Prenatal labs: ABO, Rh:   A+ Antibody:   Rubella:  immune RPR:   NR HBsAg:   neg HIV:   neg GBS:   positive  Hgb 12.9/Plt 383/Ur Cx neg/GC neg/Chl neg/Hgb electro WNL/ TSH WNL/  Assessment/Plan: 36yo G2P0010 at 37+ with category 3 tracing Had been 120's, min var, fell to 60's - to OR for stat section, see note   Kelly Harvey 07/20/2016, 5:53 PM

## 2016-07-20 NOTE — Anesthesia Procedure Notes (Signed)
Procedure Name: Intubation Performed by: Nolon Nations Pre-anesthesia Checklist: Patient identified, Emergency Drugs available, Suction available, Patient being monitored and Timeout performed Patient Re-evaluated:Patient Re-evaluated prior to inductionOxygen Delivery Method: Circle system utilized Preoxygenation: Pre-oxygenation with 100% oxygen Intubation Type: IV induction, Rapid sequence and Cricoid Pressure applied Laryngoscope Size: Glidescope and 3 Grade View: Grade I Tube type: Oral Tube size: 7.0 mm Number of attempts: 1 Airway Equipment and Method: Video-laryngoscopy Placement Confirmation: ETT inserted through vocal cords under direct vision,  positive ETCO2 and breath sounds checked- equal and bilateral Secured at: 21 cm Tube secured with: Tape Dental Injury: Teeth and Oropharynx as per pre-operative assessment

## 2016-07-20 NOTE — Brief Op Note (Signed)
07/20/2016  7:42 PM  PATIENT:  Kelly Harvey  36 y.o. female  PRE-OPERATIVE DIAGNOSIS:  primary cesarean; fetal HR indication; cat III tracing  POST-OPERATIVE DIAGNOSIS:  primary cesarean; fetal HR indication; cat III tracing  PROCEDURE:  Procedure(s): CESAREAN SECTION (N/A)  SURGEON:  Surgeon(s) and Role:    * Bovard-Stuckert, Mael Delap, MD - Primary  FINDINGS: viable female infant at 16:13, apgars 0/ 3/3 wt 5#12.8, nl PP uterus, nl B tubes and ovaries  ANESTHESIA:   general  EBL:  800cc, uop 600cc clear  BLOOD ADMINISTERED:none  DRAINS: Urinary Catheter (Foley)   LOCAL MEDICATIONS USED:  NONE  SPECIMEN:  Source of Specimen:  Placenta   DISPOSITION OF SPECIMEN:  PATHOLOGY  COUNTS:  YES  TOURNIQUET:  * No tourniquets in log *  DICTATION: .Other Dictation: Dictation Number [id number]  PLAN OF CARE: Admit to inpatient   PATIENT DISPOSITION:  PACU - hemodynamically stable.   Delay start of Pharmacological VTE agent (>24hrs) due to surgical blood loss or risk of bleeding: not applicable

## 2016-07-20 NOTE — Op Note (Signed)
NAMEDANISHA, BRASSFIELD NO.:  [id number]  MEDICAL RECORD NO.:  [id number]  LOCATION:                                FACILITY:  [facility]  PHYSICIAN:  Thornell Sartorius, MD        DATE OF BIRTH:  11-Jul-1980  DATE OF PROCEDURE:  07/20/2016 DATE OF DISCHARGE:                              OPERATIVE REPORT   PREOPERATIVE DIAGNOSIS:  Category 3 tracing, primary cesarean section.  POSTOPERATIVE DIAGNOSIS:  Category 3 tracing, primary cesarean section, delivered.  PROCEDURE:  Primary low transverse cesarean section.  SURGEON:  Thornell Sartorius, MD  ANESTHESIA:  General.  ESTIMATED BLOOD LOSS:  Approximately 800 mL.  URINE OUTPUT:  600 mL clear urine at the end of procedure.  FINDINGS:  Viable female infant at 43 with Apgars of 0 at 1 minute, 3 at 5 minutes, and 3 at 10 minutes, and a weight of 5 pounds 12.8 ounces. Normal postpartum uterus, tubes, and ovaries were noted.  COMPLICATIONS:  None.  PATHOLOGY:  Placenta.  DESCRIPTION OF PROCEDURE:  After informed consent was reviewed with the patient, the plan had been to proceed with a cesarean section.  However, the patient's strip went from 120s in minimal variability to 60s and was called stat and proceeded for delivery stat.  When she was back in the OR, she was placed on the table in supine position, leftward tilt. Foley catheter was placed.  Her belly was splashed with Betadine.  The skin incision was made after general anesthesia was induced.  A Pfannenstiel incision was made, carried through the underlying layer of fascia sharply.  The fascia was incised in the midline and extension was extended manually laterally.  Rectus muscles were dissected off bluntly. Midline was easily identified and the peritoneum was entered bluntly. This was stretched.  Using a bladder blade and a Richardson retractor, the uterus was identified, incised in a transverse fashion.  Infant was delivered from a vertex presentation.  Nose and mouth  were suctioned on the field.  Infant was minimally dry.  The cord was clamped and cut, handed off to the waiting NICU staff.  The placenta was expressed from the uterus.  The uterus was cleared of all clot and debris.  The uterine incision was closed with 2 layers of 0 Monocryl, first of which was running locked and the second as an imbricating layer.  This was noted to be hemostatic.  Clots were cleared and debris were cleared from the gutters bilaterally.  The peritoneum was reapproximated with 2-0 Vicryl. The fascia was reapproximated with 0 Vicryl in a running fashion.  After inspection, found there was no bleeding, removed the fascia. Subcuticular adipose layer was made hemostatic with Bovie cautery.  The dead space was closed with 3-0 plain gut in a running suture.  4-0 Vicryl on a Keith needle was used to close the skin subcuticularly.  Benzoin and sutures were applied.  The patient tolerated the procedure well.  Sponge, lap, and needle counts were correct x2 per the operating room staff.  Infant was delivered in approximately 1 minute.     Thornell Sartorius, MD   ______________________________ Thornell Sartorius, MD  JB/MEDQ  D:  07/20/2016  T:  07/20/2016  Job:  [id number]

## 2016-07-20 NOTE — MAU Note (Signed)
In room FHR down to 54 bpm, pt turned and repositioned. Lab at bedside to draw blood for labs, unable to obtain blood draw due to FHR

## 2016-07-20 NOTE — MAU Note (Signed)
Dr Melba Coon at bedside discussing plan of care with pt, pt and husband voiced understanding.

## 2016-07-20 NOTE — MAU Note (Signed)
STAT C/S called

## 2016-07-20 NOTE — MAU Note (Signed)
Hansel Feinstein CNM at bedside for U/S. Heart rate confirmed to be in the 50's.

## 2016-07-20 NOTE — MAU Note (Signed)
Patient to OR

## 2016-07-20 NOTE — Anesthesia Preprocedure Evaluation (Addendum)
Anesthesia Evaluation  Patient identified by MRN, date of birth, ID band Patient awake    Reviewed: Allergy & Precautions, NPO status , Patient's Chart, lab work & pertinent test resultsPreop documentation limited or incomplete due to emergent nature of procedure.  Airway Mallampati: II  TM Distance: >3 FB Neck ROM: Full    Dental no notable dental hx. (+) Teeth Intact   Pulmonary neg pulmonary ROS,    Pulmonary exam normal breath sounds clear to auscultation       Cardiovascular negative cardio ROS Normal cardiovascular exam Rhythm:Regular Rate:Normal     Neuro/Psych negative neurological ROS  negative psych ROS   GI/Hepatic negative GI ROS, Neg liver ROS,   Endo/Other  negative endocrine ROS  Renal/GU negative Renal ROS  negative genitourinary   Musculoskeletal negative musculoskeletal ROS (+)   Abdominal   Peds  Hematology negative hematology ROS (+)   Anesthesia Other Findings   Reproductive/Obstetrics (+) Pregnancy 37 6/7 weeks Non reactive NST Non reassuring FHR tracing Hx/o IVF                            Anesthesia Physical Anesthesia Plan  ASA: II and emergent  Anesthesia Plan: General   Post-op Pain Management:    Induction: Intravenous, Rapid sequence and Cricoid pressure planned  PONV Risk Score and Plan: 4 or greater and Ondansetron, Scopolamine patch - Pre-op, Treatment may vary due to age or medical condition, Dexamethasone and Propofol  Airway Management Planned: Oral ETT  Additional Equipment: None  Intra-op Plan:   Post-operative Plan: Extubation in OR  Informed Consent: I have reviewed the patients History and Physical, chart, labs and discussed the procedure including the risks, benefits and alternatives for the proposed anesthesia with the patient or authorized representative who has indicated his/her understanding and acceptance.   Dental advisory  given  Plan Discussed with: CRNA  Anesthesia Plan Comments:       Anesthesia Quick Evaluation

## 2016-07-20 NOTE — Op Note (Deleted)
  The note originally documented on this encounter has been moved the the encounter in which it belongs.  

## 2016-07-20 NOTE — Progress Notes (Signed)
   07/20/16 0002  Provider Notification  Provider Name/Title Carlynn Purl, MD  Method of Notification Phone  Asked by Juel Burrow, np to call Dr. Terri Piedra to review FHR. Dr. Terri Piedra called and informed BPP 8/8, FHR baseline 150s with moderate variability, 10x10 accel  with 38mins prolong decel Pt states positive FM now. No UC. Pt has appt today at 1530. POC stated to DC pt home and keep appt to return to MAU with DFM. Erin L, np informed of Dr. Ivor Costa POC

## 2016-07-20 NOTE — Progress Notes (Signed)
Took patient by NICU for updated on baby.  All questions answered by MD

## 2016-07-21 ENCOUNTER — Encounter (HOSPITAL_COMMUNITY): Payer: Self-pay | Admitting: Obstetrics and Gynecology

## 2016-07-21 LAB — CBC
HCT: 26 % — ABNORMAL LOW (ref 36.0–46.0)
Hemoglobin: 9 g/dL — ABNORMAL LOW (ref 12.0–15.0)
MCH: 30.1 pg (ref 26.0–34.0)
MCHC: 34.6 g/dL (ref 30.0–36.0)
MCV: 87 fL (ref 78.0–100.0)
PLATELETS: 255 10*3/uL (ref 150–400)
RBC: 2.99 MIL/uL — AB (ref 3.87–5.11)
RDW: 13 % (ref 11.5–15.5)
WBC: 19.8 10*3/uL — ABNORMAL HIGH (ref 4.0–10.5)

## 2016-07-21 LAB — RPR: RPR: NONREACTIVE

## 2016-07-21 NOTE — Progress Notes (Signed)
Wasted 23 mg PCA dilaudid with Real Cons, RN

## 2016-07-21 NOTE — Lactation Note (Signed)
This note was copied from a baby's chart. Lactation Consultation Note  Patient Name: Kelly Harvey [id number] Date: 07/21/2016 Reason for consult: Initial assessment;Infant < 6lbs;NICU baby   Follow up with mom of 51 hour old NICU infant. Mom was pumping when LC entered room. She obtained 1-2 ml for right breast and a few gtts from right breast. Mom using # 27 flanges, LC feels # 24 would be a better fit, parents to change to # 24 flanges with next pumping.   Enc mom to pump with DEBP every 2-3 hours during day and every 4 hours at night for 15 minutes on Initiate setting. Enc mom to hand express after pumping. Mom was able to hand express.   Providing Milk for Your Baby in NICU Booklet given, breast milk handling and storage, pumping schedule and what to expect with pumping reviewed. Enc mom to pump after visiting infant and to relax with pumping.  Reviewed labeling of breast milk and colostrum collection containers given.   Mom and dad with further questions/concerns at this time.   Mom is a [facility] client and was informed of WIC pump loaners and to call [facility] post d/c. Parents voiced understanding. [place] referral sent to [facility] office.    Maternal Data Formula Feeding for Exclusion: No Has patient been taught Hand Expression?: Yes Does the patient have breastfeeding experience prior to this delivery?: No  Feeding    LATCH Score/Interventions                      Lactation Tools Discussed/Used WIC Program: Yes Pump Review: Setup, frequency, and cleaning;Milk Storage Initiated by:: Reviewed and encouraged   Consult Status Consult Status: Follow-up Date: 07/22/16 Follow-up type: In-patient    Kelly Harvey 07/21/2016, 9:55 AM

## 2016-07-21 NOTE — Progress Notes (Signed)
Subjective: Postpartum Day 1: Stat Cesarean Delivery/Category 3 tracing and fetal bradycardia Patient reports tolerating PO and no problems voiding. She is still on a PCA for pain so will transition to po meds.  Trying to begin pumping.    Objective: Vital signs in last 24 hours: Temp:  [97.7 F (36.5 C)-99 F (37.2 C)] 98.4 F (36.9 C) (07/03 0747) Pulse Rate:  [62-91] 62 (07/03 0747) Resp:  [10-20] 18 (07/03 0747) BP: (107-132)/(55-82) 117/63 (07/03 0747) SpO2:  [95 %-100 %] 97 % (07/03 0747) Weight:  [90.7 kg (200 lb)] 90.7 kg (200 lb) (07/02 2300)  Physical Exam:  General: alert and cooperative Lochia: appropriate Uterine Fundus: firm Incision: C/D/I    Recent Labs  07/20/16 2006 07/21/16 0529  HGB 9.9* 9.0*  HCT 29.2* 26.0*    Assessment/Plan: Status post Cesarean section.  Normal postoperative progress.  Will transition to po meds and try to d/c IV so can get back and forth to NICU easier. Baby stable per father.  Weaned to CPAP and moving extremities.  Having chest X-ray and on antibiotics.  Awaiting placenta pathology to see if indicates any infection.  D/w pt and husband in detail.   Logan Bores 07/21/2016, 9:13 AM

## 2016-07-21 NOTE — Anesthesia Postprocedure Evaluation (Signed)
Anesthesia Post Note  Patient: Kelly Harvey  Procedure(s) Performed: Procedure(s) (LRB): CESAREAN SECTION (N/A)     Patient location during evaluation: [facility] Anesthesia Type: General Level of consciousness: awake and alert and oriented Pain management: pain level controlled Vital Signs Assessment: post-procedure vital signs reviewed and stable Respiratory status: spontaneous breathing and nonlabored ventilation Cardiovascular status: stable Postop Assessment: no signs of nausea or vomiting and adequate PO intake Anesthetic complications: no    Last Vitals:  Vitals:   07/21/16 0200 07/21/16 0400  BP:  (!) 107/55  Pulse:  67  Resp: 20 18  Temp:  36.9 C    Last Pain:  Vitals:   07/21/16 0500  TempSrc:   PainSc: Asleep   Pain Goal: Patients Stated Pain Goal: 3 (07/21/16 0030)               Jabier Mutton

## 2016-07-22 NOTE — Progress Notes (Signed)
Patient ID: Kelly Harvey, female   DOB: Nov 14, 1980, 36 y.o.   MRN: [id number] POD #2 Doing ok, pain ok, baby stable so far in NICU Afeb, VSS Abd- soft, fundus firm, incision intact Continue routine care, ambulation encouraged

## 2016-07-22 NOTE — Lactation Note (Signed)
This note was copied from a baby's chart. Lactation Consultation Note  Patient Name: Kelly Harvey [id number] Date: 07/22/2016 Reason for consult: Follow-up assessment;NICU baby  NICU baby 59 hours old. Mom reports that she pumped last night around midnight, and then just has finished pumping and collected 3-4 ml of EBM--which [name] has taken to NICU. Discussed supply and demand and enc pumping every 2-3 hours for a total of 8-12 times/24 hours followed by hand expression. Mom reports that [name] has been in contact with [facility] about a DEBP. Mom aware of [facility] loaner program as well. Enc mom to take pumping kit with her at D/C, and mom aware of pumping rooms in NICU.  Maternal Data    Feeding    LATCH Score/Interventions                      Lactation Tools Discussed/Used Tools: Pump Breast pump type: Double-Electric Breast Pump   Consult Status Consult Status: Follow-up Date: 07/23/16 Follow-up type: In-patient    Andres Labrum 07/22/2016, 10:32 AM

## 2016-07-23 MED ORDER — OXYCODONE-ACETAMINOPHEN 5-325 MG PO TABS: 1.0000 | ORAL_TABLET | ORAL | 0 refills | Status: DC | PRN

## 2016-07-23 MED ORDER — IBUPROFEN 600 MG PO TABS: 600.0000 mg | ORAL_TABLET | Freq: Four times a day (QID) | ORAL | 1 refills | Status: DC | PRN

## 2016-07-23 NOTE — Progress Notes (Signed)
CSW attempted to meet with parents to offer support and complete assessment due to NICU admission, but they were not in their room at this time.  CSW will attempt again later today.

## 2016-07-23 NOTE — Discharge Summary (Signed)
    OB Discharge Summary     Patient Name: Kelly Harvey DOB: [birth year] MRN: [id number]  Date of admission: 07/20/2016 Delivering MD: Janyth Contes   Date of discharge: 07/23/2016  Admitting diagnosis: 37.6wks  monitoring Intrauterine pregnancy: [redacted]w[redacted]d     Secondary diagnosis:  Active Problems:   S/P cesarean section  Additional problems: none     Discharge diagnosis: Term Pregnancy Delivered                                                                                                Post partum procedures:none  Augmentation: n/a  Complications: None  Hospital course: Non reassuring fetal tracing noted; pt for stat c/s. Pt recovering well. Discharge to home today. Baby n NICU   Physical exam  Vitals:   07/22/16 0819 07/22/16 1208 07/22/16 1950 07/23/16 0804  BP: 119/76 123/61 115/70 114/80  Pulse: 69 83 87 88  Resp: 16 16 18 18   Temp: 98.2 F (36.8 C) 97.8 F (36.6 C) 98 F (36.7 C) 98.2 F (36.8 C)  TempSrc: Oral Oral Oral Oral  SpO2: 100% 100% 100% 100%  Weight:      Height:       General: alert, cooperative and no distress Lochia: appropriate Uterine Fundus: firm Incision: Healing well with no significant drainage; c/d/i DVT Evaluation: No significant calf/ankle edema. Labs: Lab Results  Component Value Date   WBC 19.8 (H) 07/21/2016   HGB 9.0 (L) 07/21/2016   HCT 26.0 (L) 07/21/2016   MCV 87.0 07/21/2016   PLT 255 07/21/2016   No flowsheet data found.  Discharge instruction: per After Visit Summary and "Baby and Me Booklet".  After visit meds:  Allergies as of 07/23/2016   No Known Allergies     Medication List    STOP taking these medications   cyclobenzaprine 10 MG tablet Commonly known as:  FLEXERIL     TAKE these medications   folic acid 009 MCG tablet Commonly known as:  FOLVITE Take 400 mcg by mouth daily.   ibuprofen 600 MG tablet Commonly known as:  ADVIL,MOTRIN Take 1 tablet (600 mg total) by mouth every 6 (six) hours as  needed for mild pain.   oxyCODONE-acetaminophen 5-325 MG tablet Commonly known as:  PERCOCET Take 1 tablet by mouth every 4 (four) hours as needed for severe pain.       Diet: routine diet  Activity: Advance as tolerated. Pelvic rest for 6 weeks.   Outpatient follow up:2 weeks Follow up Appt:No future appointments. Follow up Visit:No Follow-up on file.  Postpartum contraception: Undecided  Newborn Data: Live born female  Birth Weight: 5 lb 12.8 oz (2630 g) APGAR: 0, 3  Baby Feeding: Breast Disposition:NICU   07/23/2016 Isaiah Serge, DO

## 2016-07-23 NOTE — Progress Notes (Signed)
Patient ID: Kelly Harvey, female   DOB: [birth year], 36 y.o.   MRN: [id number] Pt s/e. Lots of questions about cause of NRFHT and baby progress. Answered most (general)  but encouraged to speak to neonatologist and Dr Melba Coon for specifics. She feels well - pain controlled with meds.  Lochia mild. No CP, SOB or lightheadedness. Ambulating and tolerating diet well. Teary about going home today VSS ABD - dressing c/d/i, FF EXT - trace edema, no homans  A/P: POD#3 s/p stat c/s         Likely discharge to home today         F/u in 2 weeks for incision check and in 6 weeks for pp visit

## 2016-07-23 NOTE — Clinical Social Work Maternal (Signed)
CLINICAL SOCIAL WORK MATERNAL/CHILD NOTE  Patient Details  Name: Hydie Langan MRN: [id number] Date of Birth: 09/19/1980  Date:  07/23/2016  Clinical Social Worker Initiating Note:  Terri Piedra, LCSW Date/ Time Initiated:  07/23/16/1545     Child's Name:  Mora Appl Alahmar   Legal Guardian:  Other (Comment) (Parents: Earnestine Leys and Cytogeneticist)   Need for Interpreter:  None (Parents speak English fluently.)   Date of Referral:   (No referral-NICU admission.  No social issues noted in MOB's chart.)     Reason for Referral:      Referral Source:      Address:  [address] [place], [place] [address]  Phone number:  [phone]   Household Members:  Significant Other   Natural Supports (not living in the home):  Immediate Family (Parents report that [name]'s brother lives here (and was with parents today).  [name] states that MOB's parents are visiting for 2-3 months from [place], which is where the rest of the family lives.)   Professional Supports: None   Employment:     Type of Work: [name] is an Therapist, music.   Education:      Museum/gallery curator Resources:  Medicaid   Other Resources:  [facility]   Cultural/Religious Considerations Which May Impact Care: None stated.  MOB's facesheet notes religion as Muslim.  Strengths:  Ability to meet basic needs , Home prepared for child  (Parents report that they are prepared for baby except for the car seat.  They have not yet chosen a pediatrician and discussed this with CSW.)   Risk Factors/Current Problems:  Adjustment to Illness    Cognitive State:  Able to Concentrate , Alert , Linear Thinking , Insightful    Mood/Affect:  Calm , Comfortable , Tearful , Interested    CSW Assessment: CSW met with parents and [name]'s brother in MOB's third floor room to offer support and complete assessment due to baby's admission to NICU.  Parents were extremely pleasant and welcoming of CSW's visit.  They gave permission to speak  openly with [name]'s brother in the room.   [name] did most of the talking and appears more comfortable speaking English, but both parents appear to speak and understand [place] fluently.  [name] reports that they are doing much better today than they were initially as baby seems to be doing better.  [name] states "we just cried the first day."  CSW validated and normalized their emotions and encouraged them to allow themselves to be emotional.  MOB was tearful at times throughout the assessment and states she has not wanted to allow herself to cry because she has been told that it will interfere with milk production.  CSW explained that stress can hinder production, but again encouraged her to allow herself to cry and process her emotions as suppressing emotions is not healthy.  CSW explained the importance of taking care of herself: eating well, sleeping, etc, and processing her emotions.  MOB smiled and seemed appreciative of the permission to cry.  CSW provided education on PMADs and asked parents to talk with CSW and or MD if they have concerns about their emotions or ability to cope with this experience at any time.  MOB agreed.   Parents report having all needed supplies for infant at home other than the car seat and asked if there are car seats available at the hospital.  CSW explained that the car seat program is for families who have no other means to  purchase a seat prior to discharge and asked them to let CSW know if they feel this is there situation.  CSW did not discuss SIDS precautions at this time since the conversation was focused on emotional care now and throughout NICU course, but feels it is extremely important to follow up on prior to discharge. CSW explained ongoing support services offered by NICU CSW and gave contact information.  Family seemed very appreciative of CSW's visit and concern for their emotional wellbeing.    CSW Plan/Description:  Patient/Family Education , Psychosocial Support and  Ongoing Assessment of Needs    Kalman Shan 07/23/2016, 4:34 PM

## 2016-07-23 NOTE — Discharge Instructions (Signed)
Nothing in vagina for 6 weeks.  No sex, tampons, and douching.  Other instructions as in [facility] Discharge Booklet. °

## 2016-07-24 MED ORDER — OXYCODONE-ACETAMINOPHEN 5-325 MG PO TABS: 1.0000 | ORAL_TABLET | Freq: Four times a day (QID) | ORAL | 0 refills | Status: DC | PRN

## 2016-07-24 MED ORDER — PRENATAL MULTIVITAMIN CH: 1.0000 | ORAL_TABLET | Freq: Every day | ORAL | 3 refills | Status: DC

## 2016-07-24 MED ORDER — IBUPROFEN 600 MG PO TABS: 600.0000 mg | ORAL_TABLET | Freq: Four times a day (QID) | ORAL | 1 refills | Status: DC | PRN

## 2016-07-24 NOTE — Progress Notes (Addendum)
Subjective: Postpartum Day 4: Cesarean Delivery Patient reports incisional pain, tolerating PO and no problems voiding.  Nl lochia, pain controlled  Objective: Vital signs in last 24 hours: Temp:  [98.5 F (36.9 C)] 98.5 F (36.9 C) (07/05 2151) Pulse Rate:  [83] 83 (07/05 2151) Resp:  [18] 18 (07/05 2151) BP: (123)/(79) 123/79 (07/05 2151) SpO2:  [100 %] 100 % (07/05 2151)  Physical Exam:  General: alert and no distress Lochia: appropriate Uterine Fundus: firm Incision: healing well DVT Evaluation: No evidence of DVT seen on physical exam.  No results for input(s): HGB, HCT in the last 72 hours.  Assessment/Plan: Status post Cesarean section. Doing well postoperatively.  Discharge home with standard precautions and return to clinic in 2 weeks.  D/C with motrin, percocet and PNV.   Baby stable in NICU, has been rewarmed. On [place], off abx  Pt is expressing breastmilk working w lactation  Latrisa Hellums Bovard-Stuckert 07/24/2016, 8:24 AM

## 2016-07-24 NOTE — Lactation Note (Signed)
This note was copied from a baby's chart. Lactation Consultation Note  Patient Name: Kelly Harvey PQAES'L Date: 07/24/2016 Reason for consult: Follow-up assessment;NICU baby  NICU baby 72 hours old. Mom reports that she is pumping every 2-3 hours and is collecting 2-3 ounces of EBM which she and [name] are taking to NICU. [name] given hotline number for [facility] and enc to call for an appointment for today. Parents aware of [facility] loaner program and have this LC's extension if they need a [facility] loaner pump. Mom reports her breasts are filling, and upon palpation mom's breast still feel soft. Discussed engorgement prevention/treatment. Mom aware of how to transport milk back to hospital, and location of pumping rooms in NICU. Parents aware of OP/BFSG and [place] phone line assistance after D/C.   Maternal Data    Feeding    LATCH Score/Interventions                      Lactation Tools Discussed/Used Tools: Pump Breast pump type: Double-Electric Breast Pump   Consult Status Consult Status: PRN    Andres Labrum 07/24/2016, 9:32 AM

## 2016-07-24 NOTE — Discharge Summary (Signed)
OB Discharge Summary     Patient Name: Kelly Harvey DOB: 1980-02-14 MRN: [id number]  Date of admission: 07/20/2016 Delivering MD: Janyth Contes   Date of discharge: 07/24/2016  Admitting diagnosis: 37.6wks  monitoring Intrauterine pregnancy: [redacted]w[redacted]d     Secondary diagnosis:  Active Problems:   S/P cesarean section  Additional problems: Category 3 tracing     Discharge diagnosis: Term Pregnancy Delivered                                                                                                Post partum procedures:none  Augmentation: N/A  Complications: None  Hospital course:  Onset of Labor With Unplanned C/S  36 y.o. yo G1P1000 at [redacted]w[redacted]d was admitted with concerning fetal heart tracing on 07/20/2016. Patient had a labor course significant for category 3 tracing -  Membrane Rupture Time/Date: 4:12 PM ,07/20/2016   The patient went for cesarean section due to Non-Reassuring FHR, and delivered a Viable infant,07/20/2016  Details of operation can be found in separate operative note. Patient had an uncomplicated postpartum course.  She is ambulating,tolerating a regular diet, passing flatus, and urinating well.  Patient is discharged home in stable condition 07/24/16.  Physical exam  Vitals:   07/22/16 1208 07/22/16 1950 07/23/16 0804 07/23/16 2151  BP: 123/61 115/70 114/80 123/79  Pulse: 83 87 88 83  Resp: 16 18 18 18   Temp: 97.8 F (36.6 C) 98 F (36.7 C) 98.2 F (36.8 C) 98.5 F (36.9 C)  TempSrc: Oral Oral Oral Oral  SpO2: 100% 100% 100% 100%  Weight:      Height:       General: alert and no distress Lochia: appropriate Uterine Fundus: firm Incision: Healing well with no significant drainage DVT Evaluation: No evidence of DVT seen on physical exam. Labs: Lab Results  Component Value Date   WBC 19.8 (H) 07/21/2016   HGB 9.0 (L) 07/21/2016   HCT 26.0 (L) 07/21/2016   MCV 87.0 07/21/2016   PLT 255 07/21/2016   No flowsheet data found.  Discharge  instruction: per After Visit Summary and "Baby and Me Booklet".  After visit meds:  Allergies as of 07/24/2016   No Known Allergies     Medication List    STOP taking these medications   cyclobenzaprine 10 MG tablet Commonly known as:  FLEXERIL     TAKE these medications   folic acid 256 MCG tablet Commonly known as:  FOLVITE Take 400 mcg by mouth daily.   ibuprofen 600 MG tablet Commonly known as:  ADVIL,MOTRIN Take 1 tablet (600 mg total) by mouth every 6 (six) hours as needed for mild pain.   oxyCODONE-acetaminophen 5-325 MG tablet Commonly known as:  PERCOCET Take 1-2 tablets by mouth every 6 (six) hours as needed for severe pain.   prenatal multivitamin Tabs tablet Take 1 tablet by mouth daily at 12 noon.       Diet: routine diet  Activity: Advance as tolerated. Pelvic rest for 6 weeks.   Outpatient follow up:1 and 6 weeks Follow up Appt:No future appointments. Follow up Visit:No Follow-up on file.  Postpartum contraception: Not Discussed  Newborn Data: Live born female  Birth Weight: 5 lb 12.8 oz (2630 g) APGAR: 0, 3  Baby Feeding: Breast Disposition:NICU   07/24/2016 Janyth Contes, MD

## 2016-07-30 ENCOUNTER — Inpatient Hospital Stay (HOSPITAL_COMMUNITY)
Admission: AD | Admit: 2016-07-30 | Discharge: 2016-07-30 | Discharge status: Against Medical Advice | Payer: Medicaid Other | Source: Ambulatory Visit | Attending: Obstetrics and Gynecology | Admitting: Obstetrics and Gynecology

## 2016-07-30 NOTE — MAU Note (Signed)
Pt. Called from lobby, no response.

## 2016-07-30 NOTE — MAU Note (Signed)
Called from lobby; no response. 

## 2016-07-31 LAB — PLATELET ANTIBODY PROFILE
GLYCOPROTEIN IV ANTIBODY: NEGATIVE
HLA Ab Ser Ql EIA: NEGATIVE
IB/IX Antibody: NEGATIVE
IIB/IIIA ANTIBODY: NEGATIVE

## 2016-08-04 ENCOUNTER — Ambulatory Visit: Payer: Self-pay

## 2016-08-04 NOTE — Lactation Note (Addendum)
This note was copied from a baby's chart. Lactation Consultation Note  Baby 47 weeks old in NICU and STS on mother's chest. Reviewed hand expression with mother.  Drops expressed. Attempted latching on L side.  Baby briefly latched and sucked for a few minutes.  Did some licks and nuzzled. When baby latched mother complained of strong pull.  Assisted w/ chin tug for wider latch. Mother was pulling breast tissue back away from nose.  Provided education as to how babies breath. Encouraged STS and allow baby to nuzzle at the breast. Discussed hand expressing before pumping and hands on pumping to increase supply. Recommend pumping at least q 3 hours with exception of one time during the night.   Patient Name: Kelly Harvey OVZCH'Y Date: 08/04/2016 Reason for consult: Follow-up assessment   Maternal Data    Feeding Feeding Type: Breast Fed Length of feed: 5 min  LATCH Score/Interventions Latch: Repeated attempts needed to sustain latch, nipple held in mouth throughout feeding, stimulation needed to elicit sucking reflex. Intervention(s): Adjust position;Assist with latch;Breast massage  Audible Swallowing: None  Type of Nipple: Everted at rest and after stimulation  Comfort (Breast/Nipple): Soft / non-tender     Hold (Positioning): Assistance needed to correctly position infant at breast and maintain latch.  LATCH Score: 6  Lactation Tools Discussed/Used     Consult Status Consult Status: Follow-up Date: 08/05/16 Follow-up type: In-patient    [name] [facility] 08/04/2016, 1:53 PM

## 2016-08-07 ENCOUNTER — Ambulatory Visit: Payer: Self-pay

## 2016-08-07 NOTE — Lactation Note (Signed)
This note was copied from a baby's chart. Lactation Consultation Note  69 week old infant.  Reviewed NS application. Encouraged mother to hand express before latching baby. Mother latched baby in cross cradle. Sucks and swallows observed. Discussed trying toward the end of the feeding without the NS. Mother has been pumping q 2-3 hours and receiving approx 45 ml. Reminded parents to burp,check diaper and breastfeed on both breasts if baby is willing.   Patient Name: Kelly Harvey TKKOE'C Date: 08/07/2016 Reason for consult: Follow-up assessment   Maternal Data    Feeding Feeding Type: Breast Fed Nipple Type: Slow - flow Length of feed: 20 min  LATCH Score/Interventions Latch: Grasps breast easily, tongue down, lips flanged, rhythmical sucking. Intervention(s): Adjust position;Assist with latch;Breast massage  Audible Swallowing: Spontaneous and intermittent  Type of Nipple: Everted at rest and after stimulation Intervention(s): Double electric pump  Comfort (Breast/Nipple): Soft / non-tender     Hold (Positioning): Assistance needed to correctly position infant at breast and maintain latch.  LATCH Score: 9  Lactation Tools Discussed/Used Tools: Nipple Shields Nipple shield size: 20 Breast pump type: Double-Electric Breast Pump   Consult Status Consult Status: Follow-up Date: 08/08/16 Follow-up type: In-patient    [name] [facility] 08/07/2016, 8:13 PM

## 2016-08-10 ENCOUNTER — Ambulatory Visit: Payer: Self-pay

## 2016-08-10 NOTE — Lactation Note (Addendum)
This note was copied from a baby's chart. Lactation Consultation Note  Patient Name: Kelly Harvey Date: 08/10/2016   NICU baby [name] 52 weeks old. Mom requesting another #20 NS because she lost the first one. Parents roomed in last night and gave bottles through the night, and mom states that she was too tired to attempt to nurse. Offered to assist mom with use of NS, but mom states that she intends to use at home. [name] is feeding baby a bottle of EBM. Mom states that pumping is going well. Discussed continuing to pump and supplement after each feeding until baby latching and transferring milk well at the breast. Discussed that this is a gradual process and discussed ways of knowing baby getting enough at the breast. Discussed methods of moving away from using NS. Parents aware of OP/BFSG and [place] phone line assistance after D/C.   Maternal Data    Feeding    LATCH Score/Interventions                      Lactation Tools Discussed/Used     Consult Status      Kelly Harvey 08/10/2016, 3:30 PM

## 2016-08-18 ENCOUNTER — Ambulatory Visit: Payer: Self-pay

## 2016-08-18 NOTE — Lactation Note (Signed)
This note was copied from a baby's chart. Lactation Consult  Mother's reason for visit: weight check and sore nipples Visit Type: feeding assessment  Consult:  Initial Lactation Consultant:  Justice Britain  ________________________________________________________________________ Kelly Harvey Name:  Kelly Harvey Date of Birth:  21-Mar-1980 Pediatrician:  Enriqueta Shutter Gender:  female Gestational Age [redacted] weeks with NICU admit due to low apgar     Weight at Discharge:  Weight: 3200 oz                      Date of Discharge:  07/24/2016    Filed Weights   07/20/16 2300  Weight: 3200 oz  Last weight taken from location outside of [facility]:  6#4.2oz     Location:Pediatrician's office Weight today:  6#6.9oz    ________________________________________________________________________  Mother's Name: Kelly Harvey Type of delivery:  C-Section, Low Transverse Breastfeeding Experience:  none Maternal Medical Conditions:  Polycystic ovarian syndrome, History post partum depression and Infertility Maternal Medications:  Motrin, iron, PNV  ________________________________________________________________________  Breastfeeding History (Post Discharge)  Frequency of breastfeeding:  Twice daily trying with NS Duration of feeding:  15-35minutes  Supplementation  Formula:  Volume 60-33ml Frequency:  3hours Total volume per day:  unsure       Brand: Enfamil  Breastmilk:  Volume 72ml Frequency:  varies  Method:  Bottle,   Pumping  Type of pump:  Symphony Frequency:  Stopped for 5 days Volume:  3oz  Infant Intake and Output Assessment  Voids:  7 in 24 hrs.  Color:  Clear yellow Stools:  3 in 24 hrs.  Color:  Yellow  ________________________________________________________________________  Maternal Breast Assessment  Breast:  Soft Nipple:  Erect Pain level:  Reports some pain Pain interventions:  Cream/oil using  NS  _______________________________________________________________________ Feeding Assessment/Evaluation  Initial feeding assessment:  Infant's oral assessment:  Variance  Positioning:  Technical brewer and Football Right breast  LATCH documentation:  Latch:  1 = Repeated attempts needed to sustain latch, nipple held in mouth throughout feeding, stimulation needed to elicit sucking reflex.  Audible swallowing:  1 = A few with stimulation  Type of nipple:  2 = Everted at rest and after stimulation  Comfort (Breast/Nipple): 1  Hold (Positioning):  1 = Assistance needed to correctly position infant at breast and maintain latch  LATCH score:  6  Attached assessment:  Deep  Lips flanged:  Yes.    Lips untucked:  Yes.    Suck assessment:  Nonnutritive  Tools:  Nipple shield 20 mm Instructed on use and cleaning of tool:  Yes.    Pre-feed weight:  2916 g  (6 lb. 6.9 oz.) Post-feed weight:  2924g (6 lb. 7.1 oz.) Amount transferred:  8 ml Amount supplemented:  61ml  Additional Feeding Assessment -   Infant's oral assessment:  Variance  Attached assessment:  Deep  Lips flanged:  Yes.    Lips untucked:  Yes.    Suck assessment:  Nonnutritive  Tools:  Nipple shield 20 mm Instructed on use and cleaning of tool:  Yes.    Pre-feed weight:  2924 g  (6 lb. 7.1 oz.) Post-feed weight:  2926 g (6 lb. 7.2 oz.) Amount transferred:  2 ml Amount supplemented:  90 ml   Total amount pumped post feed:  Mom to post pump at home   Total amount transferred:  10 ml Total supplement given:  90 ml  Mom is here today with Arabic hospital interpreter, [name] and NICU RN, Helene Kelp  as family support. Mom reports baby was admitted to NICU, she was pumping good for several weeks.  Mom reports difficulty with latch using NS while in hospital and also having pain with pumping.  Mom has stopped pumping X5-6 days.  Mom has been latching baby 2x daily with NS, supplementing all other feedings with formula.  Mom  reports breasts feeling more full past few days.  LC discussed with mom need to pump to protect milk supply and explained it maybe past opportunity of establishing a good supply. Baby does not feed well with only 96mls transferred at 20-64minutes attempting and only a few audible swallows.  Baby is not eager or coordinated with feedings at this time. Mom plans to keep trying some latching and will continue to supplement all feedings.  Mom is planning to work on pumping 8x/daily and offering all EBM to baby.   Mom reports some post partum depression and has appointment for follow up with OB on 08/26/16, LC encouraged mom or [name] to call OB today for reporting of moms symptoms.  Mom does have family support. Mom to call as needed to schedule follow up appointment and resources for support groups for new moms was given.   Baby noted to have difficulty with tongue function, but due to poor milk supply unable to assess completely with transferring at breast using NS.  MOm will continue to use NS due to pain without.  Mom has WIC pump.

## 2016-08-26 ENCOUNTER — Other Ambulatory Visit (HOSPITAL_COMMUNITY): Payer: Self-pay | Admitting: Obstetrics and Gynecology

## 2016-08-26 ENCOUNTER — Ambulatory Visit (HOSPITAL_COMMUNITY)
Admission: RE | Admit: 2016-08-26 | Discharge: 2016-08-26 | Disposition: A | Discharge status: Home / Self Care | Payer: Medicaid Other | Source: Ambulatory Visit | Attending: Obstetrics and Gynecology | Admitting: Obstetrics and Gynecology

## 2016-08-26 DIAGNOSIS — R609 Edema, unspecified: Secondary | ICD-10-CM

## 2016-08-26 NOTE — Progress Notes (Addendum)
VASCULAR LAB PRELIMINARY  PRELIMINARY  PRELIMINARY  PRELIMINARY  Bilateral lower extremity venous duplex completed.    Preliminary report:  Bilateral:  No evidence of DVT, superficial thrombosis, or Baker's Cyst.   Meher Kucinski, RVS 08/26/2016, 5:43 PM Entered by Toma Copier, RVS Performed by Landry Mellow, RVT, RDMS

## 2016-08-27 LAB — HM PAP SMEAR

## 2016-09-14 ENCOUNTER — Encounter: Payer: Self-pay | Admitting: Physical Therapy

## 2016-09-14 ENCOUNTER — Ambulatory Visit: Payer: Medicaid Other | Attending: Obstetrics and Gynecology | Admitting: Physical Therapy

## 2016-09-14 DIAGNOSIS — M545 Low back pain, unspecified: Secondary | ICD-10-CM

## 2016-09-14 DIAGNOSIS — O99892 Other specified diseases and conditions complicating childbirth: Secondary | ICD-10-CM

## 2016-09-14 DIAGNOSIS — M6281 Muscle weakness (generalized): Secondary | ICD-10-CM | POA: Diagnosis present

## 2016-09-14 NOTE — Therapy (Addendum)
[place], [place], [address] Phone: [phone]   Fax:  [phone]  Physical Therapy Evaluation/Discharge Patient Details  Name: Kelly Harvey MRN: [id number] Date of Birth: 05-Apr-1980 Referring Provider: Dr. Thornell Sartorius Stuckert   Encounter Date: 09/14/2016      PT End of Session - 09/14/16 2050    Visit Number 1   Number of Visits 4   Date for PT Re-Evaluation 10/19/16   PT Start Time 0932   PT Stop Time 1416   PT Time Calculation (min) 48 min   Activity Tolerance Patient tolerated treatment well   Behavior During Therapy [facility] for tasks assessed/performed      Past Medical History:  Diagnosis Date  . Allergy     Past Surgical History:  Procedure Laterality Date  . CESAREAN SECTION N/A 07/20/2016   Procedure: CESAREAN SECTION;  Surgeon: Janyth Contes, MD;  Location: [facility];  Service: Obstetrics;  Laterality: N/A;    There were no vitals filed for this visit.       Subjective Assessment - 09/14/16 1334    Subjective Patient reports with back pain s/p Cesearan Delivery on 07/20/16 (emergency , 2 weeks early, infant was in NICU for 3 weeks).  She has low back pain which does not radiate to her leg.  She denies sensory changes or LE weakness.  She is limited in her ability to stand, walk and to care for her baby.    Patient is accompained by: Family member   Limitations Lifting;Standing;Walking;House hold activities;Sitting   How long can you stand comfortably? 15 min    Patient Stated Goals She would like to be able to have less pain   Currently in Pain? Yes  none at rest    Pain Score 5   5/10-8/10   Pain Location Back   Pain Orientation Right;Left;Lower   Pain Type Post Delivery (post partum)   Pain Onset More than a month ago   Pain Frequency Intermittent   Aggravating Factors  activity    Pain Relieving Factors sitting down , changing positions, no OTC meds, has not  tried a belt   Effect of Pain on Daily Activities unable to stand with ease to change diaper, bathe, carry child            [facility] PT Assessment - 09/14/16 0001      Assessment   Medical Diagnosis low back pain    Referring Provider Dr. Thornell Sartorius Stuckert    Onset Date/Surgical Date 07/20/16   Prior Therapy No     Precautions   Precautions None     Balance Screen   Has the patient fallen in the past 6 months No     [place] residence   Additional Comments unknown, time constraints due to language barrier      Prior Function   Level of Independence Independent  spouse helps with childcare, home tasks   Vocation Unemployed   Vocation Requirements This is her first and only child    Leisure likes to exercise, would like to return to [employer]   Overall Cognitive Status Within Functional Limits for tasks assessed     Observation/Other Assessments   Focus on Therapeutic Outcomes (FOTO)  NT due to language barrier, Arabic      Observation/Other Assessments-Edema    Edema --  NT but LEs grossly edematous bilateral due to HTN  Sensation   Light Touch Appears Intact     Posture/Postural Control   Postural Limitations Rounded Shoulders;Forward head   Posture Comments mild sway back, Lt upper trunk rotated posteriorly     AROM   Lumbar Flexion WFL pain    Lumbar Extension WFL pain    Lumbar - Right Side Bend WFL pain central    Lumbar - Left Side Bend WFL pain central   Lumbar - Right Rotation WFL no pain    Lumbar - Left Rotation WFL no pain      PROM   Overall PROM Comments hips WFL, tight hamstrings      Strength   Right Hip Flexion 4/5   Right Hip ABduction 4/5   Left Hip Flexion 4/5   Left Hip ABduction 4/5   Right Knee Flexion 5/5   Right Knee Extension 5/5   Left Knee Flexion 5/5   Left Knee Extension 5/5   Lumbar Flexion --  90/90 table top hold was fair      Flexibility   Hamstrings 50-55  deg      Palpation   Palpation comment pain central to L4-L5 Rt side >L. min hypertonic lumbar paraspinals, sore Rt. periscapular               PT Education - 09/14/16 1425    Education provided Yes   Education Details pre/post natal posture, PT, HEP   Person(s) Educated Patient   Methods Explanation;Demonstration;Verbal cues;Handout   Comprehension Verbalized understanding;Need further instruction;Verbal cues required             PT Long Term Goals - 09/14/16 2057      PT LONG TERM GOAL #1   Title Pt will be I with HEP for core, low abdominal strength and hip flexibility.    Baseline unknown, given on eval   Time 4   Period Weeks   Status New   Target Date 10/19/16     PT LONG TERM GOAL #2   Title Pt will be able to stand and perform childcare tasks (diapering, dressing) without increased back pain.    Baseline unable to do >15 min , pain is moderate to severe (5/10-8/10)   Time 4   Period Weeks   Status New   Target Date 10/19/16     PT LONG TERM GOAL #3   Title Pt will be able to walk up to 30 min in the community, grocery store with min fatigue, pain in low back which resolves with sitting.    Baseline unable to do >15 min without mod to severe pain    Time 4   Period Weeks   Status New   Target Date 10/19/16     PT LONG TERM GOAL #4   Title Pt will demo proper body mechanics and posture for lifting, carrying and transporting her baby.    Baseline unknown, needs cues and reinforcement   Time 4   Period Weeks   Status New                Plan - 09/14/16 2050    Clinical Impression Statement This patient is 7 weeks post partum and presents for low complexity evaluation of low back pain which began after delivery by LTCS (CPT code  [phone])  She presents with no red flags or loss of strength, sensation. Her back pain seems more related to a muscular fatigue and deconditioning from a sedentry pregnancy and ultimate surgery.  She was given info on  lower abdominal strengthening and postural education to prevent the progression of pain and symptoms.     Clinical Presentation Stable   Clinical Decision Making Low   Rehab Potential Excellent   PT Frequency 1x / week   PT Duration 4 weeks   PT Treatment/Interventions ADLs/Self Care Home Management;Cryotherapy;Ultrasound;Functional mobility training;Patient/family education;Therapeutic activities;Moist Heat;Electrical Stimulation;Neuromuscular re-education;Manual techniques   PT Next Visit Plan check HEP, manual to low back, LS spine and continue to address posture, body mechanics related to childcare   PT Home Exercise Plan pelvic tilt (stretch), neutral TrA activation, knee fall out and horiz abd yellow band in supine    Consulted and Agree with Plan of Care Patient      Patient will benefit from skilled therapeutic intervention in order to improve the following deficits and impairments:  Decreased activity tolerance, Increased fascial restricitons, Impaired flexibility, Postural dysfunction, Pain, Increased edema, Decreased mobility, Decreased range of motion, Decreased strength  Visit Diagnosis: Delivery by cesarean section using transverse incision of lower segment of uterus  Delivery by emergency cesarean section  Acute right-sided low back pain without sciatica  Muscle weakness (generalized)     Problem List Patient Active Problem List   Diagnosis Date Noted  . S/P cesarean section 07/20/2016  . Sebaceous cyst of skin of left breast 03/13/2013  . Allergic rhinitis 06/03/2012    PAA,JENNIFER 09/14/2016, 9:08 PM  [facility] [address] [place], [place], [address] Phone: [phone]   Fax:  [phone]  Name: Kenyia Wambolt MRN: [id number] Date of Birth: 06-29-1980   Raeford Razor, PT 09/14/16 9:08 PM Phone: [phone] Fax: [phone]   PHYSICAL THERAPY DISCHARGE SUMMARY  Visits from Start of Care:  1  Current functional level related to goals / functional outcomes: See above   Remaining deficits: See above did not return    Education / Equipment: HEP, PT Plan: Patient agrees to discharge.  Patient goals were not met. Patient is being discharged due to not returning since the last visit.  ?????    Did not get approved for visits.  Raeford Razor, PT 10/21/16 1:30 PM Phone: [phone] Fax: [phone]

## 2016-09-29 ENCOUNTER — Ambulatory Visit: Payer: Medicaid Other | Admitting: Physical Therapy

## 2016-10-06 ENCOUNTER — Encounter: Payer: Medicaid Other | Admitting: Physical Therapy

## 2016-10-13 ENCOUNTER — Encounter: Payer: Medicaid Other | Admitting: Physical Therapy

## 2016-10-20 ENCOUNTER — Encounter: Payer: Medicaid Other | Admitting: Physical Therapy

## 2016-11-17 ENCOUNTER — Encounter (HOSPITAL_COMMUNITY): Payer: Self-pay | Admitting: Emergency Medicine

## 2016-11-17 ENCOUNTER — Emergency Department (HOSPITAL_COMMUNITY)
Admission: EM | Admit: 2016-11-17 | Discharge: 2016-11-17 | Disposition: A | Discharge status: Home / Self Care | Payer: Self-pay | Attending: Emergency Medicine | Admitting: Emergency Medicine

## 2016-11-17 ENCOUNTER — Emergency Department (HOSPITAL_COMMUNITY): Payer: Self-pay

## 2016-11-17 DIAGNOSIS — W25XXXA Contact with sharp glass, initial encounter: Secondary | ICD-10-CM | POA: Insufficient documentation

## 2016-11-17 DIAGNOSIS — S61411A Laceration without foreign body of right hand, initial encounter: Secondary | ICD-10-CM | POA: Insufficient documentation

## 2016-11-17 DIAGNOSIS — Y999 Unspecified external cause status: Secondary | ICD-10-CM | POA: Insufficient documentation

## 2016-11-17 DIAGNOSIS — Y92000 Kitchen of unspecified non-institutional (private) residence as  the place of occurrence of the external cause: Secondary | ICD-10-CM | POA: Insufficient documentation

## 2016-11-17 DIAGNOSIS — Z79899 Other long term (current) drug therapy: Secondary | ICD-10-CM | POA: Insufficient documentation

## 2016-11-17 DIAGNOSIS — Y93G1 Activity, food preparation and clean up: Secondary | ICD-10-CM | POA: Insufficient documentation

## 2016-11-17 MED ORDER — LIDOCAINE HCL (PF) 2 % IJ SOLN
10.0000 mL | Freq: Once | INTRAMUSCULAR | Status: AC
  Administered 2016-11-17: 10 mL
  Filled 2016-11-17: qty 10

## 2016-11-17 MED ORDER — BACITRACIN ZINC 500 UNIT/GM EX OINT
TOPICAL_OINTMENT | Freq: Once | CUTANEOUS | Status: AC
  Administered 2016-11-17: 1 via TOPICAL
  Filled 2016-11-17: qty 0.9

## 2016-11-17 NOTE — Discharge Instructions (Signed)
It was my pleasure taking care of you today!   Keep wound clean with mild soap and water. Keep area covered with a topical antibiotic ointment and bandage, keep bandage dry, and do not submerge in water for 24 hours. Ice and elevate for additional pain relief and swelling. Alternate between ibuprofen and Tylenol for additional pain relief. Follow up with your primary care doctor, [facility] or ER in approximately 5-7 days for wound recheck and suture removal. Monitor area for signs of infection to include, but not limited to: increasing pain, spreading redness, drainage/pus, worsening swelling, or fevers. Return to emergency department for emergent changing or worsening symptoms.   WOUND CARE Keep area clean and dry for 24 hours. Do not remove bandage, if applied. After 24 hours,you should change it at least once a day. Also, change the dressing if it becomes wet or dirty, or as directed by your caregiver.  Wash the wound with soap and water 2 times a day. Rinse the wound off with water to remove all soap. Pat the wound dry with a clean towel.  You may shower as usual after the first 24 hours. Do not soak the wound in water until the sutures are removed.  Return if you experience any of the following signs of infection: Swelling, redness, pus drainage, streaking, fever >101.0 F Return if you experience excessive bleeding that does not stop after 15-20 minutes of constant, firm pressure.

## 2016-11-17 NOTE — ED Triage Notes (Signed)
Glass shattered in patient's hand while she was washing dishes. Pt has 1" laceration to palm of R hand. Full ROM of R hand. Tetanus vaccine within the last year.

## 2016-11-17 NOTE — ED Provider Notes (Signed)
[facility] DEPT Provider Note   CSN: [id number] Arrival date & time: 11/17/16  1801     History   Chief Complaint Chief Complaint  Patient presents with  . Extremity Laceration    HPI Kelly Harvey is a 36 y.o. female.  The history is provided by the patient and medical records.   Kelly Harvey is a 36 y.o. female  with no pertinent PMH who presents to the Emergency Department complaining of laceration to the right hand which occurred just prior to arrival. She was washing the dishes and accidentally cut her hand on a piece of glass. No medications taken prior to arrival. Put pressure to paper towel which controlled bleeding. Tetanus vaccine updated earlier this year. Tingling to right thumb and index finger. No weakness.  Past Medical History:  Diagnosis Date  . Allergy     Patient Active Problem List   Diagnosis Date Noted  . S/P cesarean section 07/20/2016  . Sebaceous cyst of skin of left breast 03/13/2013  . Allergic rhinitis 06/03/2012    Past Surgical History:  Procedure Laterality Date  . CESAREAN SECTION N/A 07/20/2016   Procedure: CESAREAN SECTION;  Surgeon: Janyth Contes, MD;  Location: [facility];  Service: Obstetrics;  Laterality: N/A;    OB History    Gravida Para Term Preterm AB Living   1 1 1          SAB TAB Ectopic Multiple Live Births         0         Home Medications    Prior to Admission medications   Medication Sig Start Date End Date Taking? Authorizing Provider  folic acid (FOLVITE) 267 MCG tablet Take 400 mcg by mouth daily.    [provider]  ibuprofen (ADVIL,MOTRIN) 600 MG tablet Take 1 tablet (600 mg total) by mouth every 6 (six) hours as needed for mild pain. Patient not taking: Reported on 09/14/2016 07/24/16   Bovard-Stuckert, Jeral Fruit, MD  oxyCODONE-acetaminophen (PERCOCET) 5-325 MG tablet Take 1-2 tablets by mouth every 6 (six) hours as needed for severe pain. 07/24/16    Bovard-Stuckert, Jeral Fruit, MD  Prenatal Vit-Fe Fumarate-FA (PRENATAL MULTIVITAMIN) TABS tablet Take 1 tablet by mouth daily at 12 noon. 07/24/16   Janyth Contes, MD    Family History History reviewed. No pertinent family history.  Social History Social History  Substance Use Topics  . Smoking status: Never Smoker  . Smokeless tobacco: Former Systems developer  . Alcohol use No     Allergies   Patient has no known allergies.   Review of Systems Review of Systems  Musculoskeletal: Negative for arthralgias.  Skin: Positive for wound.  Neurological: Negative for weakness.     Physical Exam Updated Vital Signs BP 128/78   Pulse (!) 57   Temp 98.5 F (36.9 C) (Oral)   Resp 16   LMP 10/24/2016 (Approximate)   SpO2 99%   Physical Exam  Constitutional: She appears well-developed and well-nourished. No distress.  HENT:  Head: Normocephalic and atraumatic.  Neck: Neck supple.  Cardiovascular: Normal rate, regular rhythm and normal heart sounds.   No murmur heard. Pulmonary/Chest: Effort normal and breath sounds normal. No respiratory distress. She has no wheezes. She has no rales.  Musculoskeletal:  RUE with full ROM and 5/5 strength.  Neurological: She is alert.  Sensation to RUE intact.  Skin: Skin is warm and dry.  3 cm laceration to the right hand webspace between thumb and index finger.  Nursing  note and vitals reviewed.    ED Treatments / Results  Labs (all labs ordered are listed, but only abnormal results are displayed) Labs Reviewed - No data to display  EKG  EKG Interpretation None       Radiology Dg Hand Complete Right  Result Date: 11/17/2016 CLINICAL DATA:  Laceration with glass. EXAM: RIGHT HAND - COMPLETE 3+ VIEW COMPARISON:  None. FINDINGS: There is no evidence of fracture or dislocation. There is no evidence of arthropathy or other focal bone abnormality. Soft tissues are unremarkable. Negative for radiopaque foreign body. IMPRESSION: Negative.  Electronically Signed   By: Franchot Gallo M.D.   On: 11/17/2016 19:46    Procedures .Marland KitchenLaceration Repair Date/Time: 11/17/2016 9:15 PM Performed by: Seward Speck Authorized by: Seward Speck   Consent:    Consent obtained:  Verbal   Consent given by:  Patient   Risks discussed:  Infection, pain and poor cosmetic result Anesthesia (see MAR for exact dosages):    Anesthesia method:  Local infiltration   Local anesthetic:  Lidocaine 2% w/o epi (7ml) Laceration details:    Location:  Hand   Hand location:  R wrist   Length (cm):  3 Repair type:    Repair type:  Simple Pre-procedure details:    Preparation:  Patient was prepped and draped in usual sterile fashion and imaging obtained to evaluate for foreign bodies Exploration:    Hemostasis achieved with:  Direct pressure   Wound exploration: wound explored through full range of motion and entire depth of wound probed and visualized     Wound extent: no tendon damage noted   Treatment:    Amount of cleaning:  Standard   Irrigation solution:  Sterile saline   Irrigation method:  Syringe   Visualized foreign bodies/material removed: no   Skin repair:    Repair method:  Sutures   Suture size:  4-0   Suture material:  Prolene   Suture technique:  Simple interrupted   Number of sutures:  6 Approximation:    Approximation:  Close   Vermilion border: well-aligned   Post-procedure details:    Dressing:  Antibiotic ointment and non-adherent dressing   Patient tolerance of procedure:  Tolerated well, no immediate complications   (including critical care time)   Medications Ordered in ED Medications  lidocaine (XYLOCAINE) 2 % injection 10 mL (10 mLs Infiltration Given by Other 11/17/16 1919)  bacitracin ointment (1 application Topical Given 11/17/16 2106)     Initial Impression / Assessment and Plan / ED Course  I have reviewed the triage vital signs and the nursing notes.  Pertinent labs & imaging results that  were available during my care of the patient were reviewed by me and considered in my medical decision making (see chart for details).    Kelly Harvey is a 36 y.o. female who presents to ED for laceration of right hand. Wound thoroughly cleaned in ED today. Wound explored and bottom of wound seen in a bloodless field. Laceration repaired as dictated above. Patient counseled on home wound care. Follow up with PCP/urgent care or return to ER for suture removal in 5-7 days. Patient was urged to return to the Emergency Department for worsening pain, swelling, expanding erythema especially if it streaks away from the affected area, fever, or for any additional concerns. Patient verbalized understanding. All questions answered.   Final Clinical Impressions(s) / ED Diagnoses   Final diagnoses:  Laceration of right hand, foreign body presence unspecified, initial encounter  New Prescriptions Discharge Medication List as of 11/17/2016  8:45 PM       Ward, Ozella Almond, PA-C 11/17/16 2117    Duffy Bruce, MD 11/18/16 1140

## 2017-01-19 HISTORY — DX: Maternal care for unspecified type scar from previous cesarean delivery: O34.219

## 2017-01-27 ENCOUNTER — Encounter: Payer: Self-pay | Admitting: Family Medicine

## 2017-01-27 ENCOUNTER — Ambulatory Visit (INDEPENDENT_AMBULATORY_CARE_PROVIDER_SITE_OTHER): Payer: BLUE CROSS/BLUE SHIELD | Admitting: Family Medicine

## 2017-01-27 ENCOUNTER — Other Ambulatory Visit: Payer: Self-pay

## 2017-01-27 DIAGNOSIS — Z23 Encounter for immunization: Secondary | ICD-10-CM

## 2017-01-27 DIAGNOSIS — N926 Irregular menstruation, unspecified: Secondary | ICD-10-CM

## 2017-01-27 DIAGNOSIS — G8929 Other chronic pain: Secondary | ICD-10-CM | POA: Diagnosis not present

## 2017-01-27 DIAGNOSIS — K5901 Slow transit constipation: Secondary | ICD-10-CM | POA: Diagnosis not present

## 2017-01-27 DIAGNOSIS — E663 Overweight: Secondary | ICD-10-CM | POA: Insufficient documentation

## 2017-01-27 DIAGNOSIS — M545 Low back pain, unspecified: Secondary | ICD-10-CM

## 2017-01-27 DIAGNOSIS — K5900 Constipation, unspecified: Secondary | ICD-10-CM | POA: Insufficient documentation

## 2017-01-27 MED ORDER — POLYETHYLENE GLYCOL 3350 17 GM/SCOOP PO POWD
17.0000 g | Freq: Every day | ORAL | 1 refills | Status: DC
Start: 2017-01-27 — End: 2018-07-04

## 2017-01-27 NOTE — Progress Notes (Signed)
   Subjective:    Patient ID: Kelly Harvey, female    DOB: 07-11-1980, 37 y.o.   MRN: [id number]  HPI New to establish.  Previous MD- none.  GYN- [facility] ([facility])  R sided low back pain- pain started after delivering son in July.  Tends to carry son on L side and jut out hip for support.  Will have back tightness after sleep.  Pain improves when she doesn't have to carry son throughout the day.  Irregular menses- pt delivered son in July.  Had normal period in Sept, brown d/c in Oct, had period twice in November w/ the 2nd being heavy from 11/21-12/20 w/ clots.  Bleeding again started Jan 1, stopped Jan 7- again heavy.  Stopped breast feeding November 1st.  Hx of irregular menses.     Overweight- pt has lost 20 lbs since October first.  Is attempting to eat better.  No regular exercise.  No CP, SOB, HAs, visual changes, edema  Constipation- using bathroom every 3 days.  Will have to strain.  Not currently taking anything.  Review of Systems For ROS see HPI     Objective:   Physical Exam  Constitutional: She is oriented to person, place, and time. She appears well-developed and well-nourished. No distress.  HENT:  Head: Normocephalic and atraumatic.  Eyes: Conjunctivae and EOM are normal. Pupils are equal, round, and reactive to light.  Neck: Normal range of motion. Neck supple. No thyromegaly present.  Cardiovascular: Normal rate, regular rhythm, normal heart sounds and intact distal pulses.  No murmur heard. Pulmonary/Chest: Effort normal and breath sounds normal. No respiratory distress.  Abdominal: Soft. She exhibits no distension. There is no tenderness.  Musculoskeletal: She exhibits tenderness (TTP over R lumbar paraspinal muscle). She exhibits no edema.  Lymphadenopathy:    She has no cervical adenopathy.  Neurological: She is alert and oriented to person, place, and time.  Skin: Skin is warm and dry.  Psychiatric: She has a normal mood and affect. Her behavior is normal.    Vitals reviewed.         Assessment & Plan:

## 2017-01-27 NOTE — Assessment & Plan Note (Signed)
New to provider, ongoing for pt.  She had irregular menses prior to pregnancy and delivery.  After reviewing her recent menstrual history, I suspect that her body is still normalizing the hormones after pregnancy and breast feeding.  Asked pt to continue to document her menstrual cycles to determine if OCPs are needed.  Will follow.

## 2017-01-27 NOTE — Assessment & Plan Note (Signed)
New to provider, ongoing for pt.  Encouraged use of Miralax.  Will follow.

## 2017-01-27 NOTE — Assessment & Plan Note (Signed)
New to provider, ongoing for pt.  After watching pt hold her 6 month old and jutting her hip to the side, I explained the pain is positional.  Encouraged ibuprofen and heating pad as needed.  Also encouraged her to put him down as much as possible.  Pt expressed understanding and is in agreement w/ plan.

## 2017-01-27 NOTE — Assessment & Plan Note (Signed)
New to provider, ongoing for pt.  She has lost 20 lbs since October by changing her diet.  Applauded her efforts and encouraged her to add exercise to her daily routine.  As she is post-partum, will give another 6 months of weight loss prior to checking labs to risk stratify.  Pt expressed understanding and is in agreement w/ plan.

## 2017-01-27 NOTE — Patient Instructions (Signed)
Schedule your complete physical in 6 months Take the Miralax daily for constipation and then once bowel movements become regular, you can decrease your use to as needed Please keep track of your periods so we can determine if there is a pattern- this is also something you can discuss with Dr Erskine Speed are doing great with your weight loss- keep up the good work!!! Continue to focus on healthy diet and regular exercise The back pain is from holding the baby.  Take ibuprofen as needed for pain and try and stand up straight (or put him down when it is hurting) Call with any questions or concerns Welcome!  We're glad to have you!!!

## 2017-04-04 ENCOUNTER — Inpatient Hospital Stay (HOSPITAL_COMMUNITY)
Admission: AD | Admit: 2017-04-04 | Discharge: 2017-04-04 | Disposition: A | Discharge status: Home / Self Care | Payer: BLUE CROSS/BLUE SHIELD | Source: Ambulatory Visit | Attending: Obstetrics and Gynecology | Admitting: Obstetrics and Gynecology

## 2017-04-04 ENCOUNTER — Other Ambulatory Visit: Payer: Self-pay

## 2017-04-04 ENCOUNTER — Inpatient Hospital Stay (HOSPITAL_COMMUNITY): Payer: BLUE CROSS/BLUE SHIELD

## 2017-04-04 ENCOUNTER — Encounter (HOSPITAL_COMMUNITY): Payer: Self-pay

## 2017-04-04 DIAGNOSIS — O09511 Supervision of elderly primigravida, first trimester: Secondary | ICD-10-CM | POA: Insufficient documentation

## 2017-04-04 DIAGNOSIS — O26891 Other specified pregnancy related conditions, first trimester: Secondary | ICD-10-CM | POA: Diagnosis present

## 2017-04-04 DIAGNOSIS — O26851 Spotting complicating pregnancy, first trimester: Secondary | ICD-10-CM | POA: Insufficient documentation

## 2017-04-04 DIAGNOSIS — Z3A01 Less than 8 weeks gestation of pregnancy: Secondary | ICD-10-CM | POA: Diagnosis not present

## 2017-04-04 DIAGNOSIS — Z79899 Other long term (current) drug therapy: Secondary | ICD-10-CM | POA: Diagnosis not present

## 2017-04-04 DIAGNOSIS — O4691 Antepartum hemorrhage, unspecified, first trimester: Secondary | ICD-10-CM

## 2017-04-04 DIAGNOSIS — O3680X0 Pregnancy with inconclusive fetal viability, not applicable or unspecified: Secondary | ICD-10-CM | POA: Diagnosis not present

## 2017-04-04 DIAGNOSIS — R1030 Lower abdominal pain, unspecified: Secondary | ICD-10-CM | POA: Diagnosis present

## 2017-04-04 DIAGNOSIS — O4690 Antepartum hemorrhage, unspecified, unspecified trimester: Secondary | ICD-10-CM

## 2017-04-04 LAB — URINALYSIS, ROUTINE W REFLEX MICROSCOPIC
Bilirubin Urine: NEGATIVE
GLUCOSE, UA: NEGATIVE mg/dL
KETONES UR: NEGATIVE mg/dL
Leukocytes, UA: NEGATIVE
NITRITE: NEGATIVE
PH: 6 (ref 5.0–8.0)
Protein, ur: NEGATIVE mg/dL
SPECIFIC GRAVITY, URINE: 1.005 (ref 1.005–1.030)

## 2017-04-04 LAB — CBC
HCT: 39.7 % (ref 36.0–46.0)
HEMOGLOBIN: 13.9 g/dL (ref 12.0–15.0)
MCH: 30.4 pg (ref 26.0–34.0)
MCHC: 35 g/dL (ref 30.0–36.0)
MCV: 86.9 fL (ref 78.0–100.0)
PLATELETS: 321 10*3/uL (ref 150–400)
RBC: 4.57 MIL/uL (ref 3.87–5.11)
RDW: 13.1 % (ref 11.5–15.5)
WBC: 10.4 10*3/uL (ref 4.0–10.5)

## 2017-04-04 LAB — WET PREP, GENITAL
CLUE CELLS WET PREP: NONE SEEN
Sperm: NONE SEEN
TRICH WET PREP: NONE SEEN
Yeast Wet Prep HPF POC: NONE SEEN

## 2017-04-04 LAB — HCG, QUANTITATIVE, PREGNANCY: hCG, Beta Chain, Quant, S: 6823 m[IU]/mL — ABNORMAL HIGH (ref <5)

## 2017-04-04 LAB — POCT PREGNANCY, URINE: Preg Test, Ur: POSITIVE — AB

## 2017-04-04 NOTE — Progress Notes (Signed)
Patient reports her first baby was IVF.

## 2017-04-04 NOTE — MAU Provider Note (Signed)
History     CSN: [id number]  Arrival date and time: 04/04/17 1332   First Provider Initiated Contact with Patient 04/04/17 1417     Chief Complaint  Patient presents with  . Spotting   HPI Kelly Harvey is a 37 y.o. G2P1000 at [redacted]w[redacted]d who presents with vaginal spotting for 2 weeks. She reports a sure LMP of 1/3 and a positive pregnancy test at home. She states for the last two week she has had brown or light pink spotting when she wipes. She reports lower abdominal pain that she'd rate a 4/10 and has not tried anything for the pain.   OB History    Gravida Para Term Preterm AB Living   2 1 1          SAB TAB Ectopic Multiple Live Births         0        Past Medical History:  Diagnosis Date  . Allergy   . History of chicken pox   . Newborn product of in vitro fertilization (IVF) pregnancy     Past Surgical History:  Procedure Laterality Date  . CESAREAN SECTION N/A 07/20/2016   Procedure: CESAREAN SECTION;  Surgeon: Janyth Contes, MD;  Location: [facility];  Service: Obstetrics;  Laterality: N/A;    Family History  Problem Relation Age of Onset  . Diabetes Mother   . Hyperlipidemia Mother   . Hypertension Mother   . Miscarriages / [place] Mother   . Hyperlipidemia Father   . Hypertension Father   . Healthy Brother   . Healthy Son   . Cancer Paternal Aunt        brain  . Healthy Brother   . Healthy Brother   . Healthy Brother     Social History   Tobacco Use  . Smoking status: Never Smoker  . Smokeless tobacco: Former Network engineer Use Topics  . Alcohol use: No  . Drug use: No    Allergies: No Known Allergies  Medications Prior to Admission  Medication Sig Dispense Refill Last Dose  . acetaminophen (TYLENOL) 500 MG tablet Take 1,000 mg by mouth every 6 (six) hours as needed for mild pain.   Past Week at Unknown time  . BIOTIN PO Take 1 each by mouth daily.    Past Week at Unknown time  . folic acid (FOLVITE) 295 MCG tablet Take 800  mcg by mouth daily.   04/04/2017 at Unknown time  . metroNIDAZOLE (METROGEL) 0.75 % gel Apply 1 application topically 2 (two) times daily.   Past Week at Unknown time  . polyethylene glycol powder (GLYCOLAX/MIRALAX) powder Take 17 g by mouth daily. As needed for constipation 3350 g 1 Past Week at Unknown time    Review of Systems  Constitutional: Negative.  Negative for fatigue and fever.  HENT: Negative.   Respiratory: Negative.  Negative for shortness of breath.   Cardiovascular: Negative.  Negative for chest pain.  Gastrointestinal: Positive for abdominal pain. Negative for constipation, diarrhea, nausea and vomiting.  Genitourinary: Positive for vaginal bleeding. Negative for dysuria.  Neurological: Negative.  Negative for dizziness and headaches.   Physical Exam   Blood pressure 113/70, pulse 75, temperature 98.6 F (37 C), temperature source Oral, resp. rate 20, height 5\' 5"  (1.651 m), weight 163 lb 12 oz (74.3 kg), last menstrual period 01/21/2017, SpO2 99 %, unknown if currently breastfeeding.  Physical Exam  Nursing note and vitals reviewed. Constitutional: She is oriented to person, place, and time.  She appears well-developed and well-nourished. No distress.  HENT:  Head: Normocephalic.  Eyes: Pupils are equal, round, and reactive to light.  Cardiovascular: Normal rate, regular rhythm and normal heart sounds.  Respiratory: Effort normal and breath sounds normal. No respiratory distress.  GI: Soft. Bowel sounds are normal. She exhibits no distension. There is no tenderness.  Neurological: She is alert and oriented to person, place, and time.  Skin: Skin is warm and dry.  Psychiatric: She has a normal mood and affect. Her behavior is normal. Judgment and thought content normal.    MAU Course  Procedures Results for orders placed or performed during the hospital encounter of 04/04/17 (from the past 24 hour(s))  Urinalysis, Routine w reflex microscopic     Status: Abnormal    Collection Time: 04/04/17  1:52 PM  Result Value Ref Range   Color, Urine YELLOW (A) YELLOW   APPearance HAZY (A) CLEAR   Specific Gravity, Urine 1.005 1.005 - 1.030   pH 6.0 5.0 - 8.0   Glucose, UA NEGATIVE NEGATIVE mg/dL   Hgb urine dipstick LARGE (A) NEGATIVE   Bilirubin Urine NEGATIVE NEGATIVE   Ketones, ur NEGATIVE NEGATIVE mg/dL   Protein, ur NEGATIVE NEGATIVE mg/dL   Nitrite NEGATIVE NEGATIVE   Leukocytes, UA NEGATIVE NEGATIVE   RBC / HPF 6-30 0 - 5 RBC/hpf   WBC, UA 0-5 0 - 5 WBC/hpf   Bacteria, UA MANY (A) NONE SEEN   Squamous Epithelial / LPF 0-5 (A) NONE SEEN   Mucus PRESENT    Budding Yeast PRESENT   Pregnancy, urine POC     Status: Abnormal   Collection Time: 04/04/17  2:05 PM  Result Value Ref Range   Preg Test, Ur POSITIVE (A) NEGATIVE  CBC     Status: None   Collection Time: 04/04/17  2:35 PM  Result Value Ref Range   WBC 10.4 4.0 - 10.5 K/uL   RBC 4.57 3.87 - 5.11 MIL/uL   Hemoglobin 13.9 12.0 - 15.0 g/dL   HCT 39.7 36.0 - 46.0 %   MCV 86.9 78.0 - 100.0 fL   MCH 30.4 26.0 - 34.0 pg   MCHC 35.0 30.0 - 36.0 g/dL   RDW 13.1 11.5 - 15.5 %   Platelets 321 150 - 400 K/uL  hCG, quantitative, pregnancy     Status: Abnormal   Collection Time: 04/04/17  2:35 PM  Result Value Ref Range   hCG, Beta Chain, Quant, S 6,823 (H) <5 mIU/mL  Wet prep, genital     Status: Abnormal   Collection Time: 04/04/17  3:30 PM  Result Value Ref Range   Yeast Wet Prep HPF POC NONE SEEN NONE SEEN   Trich, Wet Prep NONE SEEN NONE SEEN   Clue Cells Wet Prep HPF POC NONE SEEN NONE SEEN   WBC, Wet Prep HPF POC FEW (A) NONE SEEN   Sperm NONE SEEN    US Ob Less Than 14 Weeks With Ob Transvaginal  Result Date: 04/04/2017 CLINICAL DATA:  Pregnant patient with vaginal bleeding. EXAM: OBSTETRIC <14 WK [place] AND TRANSVAGINAL OB US TECHNIQUE: Both transabdominal and transvaginal ultrasound examinations were performed for complete evaluation of the gestation as well as the maternal uterus, adnexal  regions, and pelvic cul-de-sac. Transvaginal technique was performed to assess early pregnancy. COMPARISON:  None. FINDINGS: Intrauterine gestational sac: Single Yolk sac:  Not Visualized. Embryo:  Not Visualized. Cardiac Activity: Not Visualized. MSD: 5 mm   5 w   1 d Subchorionic hemorrhage:  None visualized. Maternal uterus/adnexae: Normal right and left ovaries. No adnexal masses identified. No free fluid in the pelvis. IMPRESSION: Probable early intrauterine gestational sac, but no yolk sac, fetal pole, or cardiac activity yet visualized. Recommend follow-up quantitative B-HCG levels and follow-up US in 14 days to assess viability. This recommendation follows SRU consensus guidelines: Diagnostic Criteria for Nonviable Pregnancy Early in the First Trimester. [employer] Med 2013; 076:8088-11. Electronically Signed   By: Lovey Newcomer M.D.   On: 04/04/2017 15:50   MDM UA, UPT CBC, HCG, ABO/Rh Wet prep and gc/chlamydia US OB Transvaginal US OB Comp Less 14 weeks  Consulted with Dr. Marvel Plan- will have patient call office tomorrow and follow up for an ultrasound in the office next week.  Assessment and Plan   1. Pregnancy of unknown anatomic location   2. Vaginal bleeding in pregnancy   3. [redacted] weeks gestation of pregnancy    -Discharge home in stable condition -Vaginal bleeding precautions discussed -Patient advised to follow-up with [facility] tomorrow to scheduled ultrasound -Patient may return to MAU as needed or if her condition were to change or worsen  [place] 04/04/2017, 2:32 PM

## 2017-04-04 NOTE — MAU Note (Addendum)
Pt presents with c/o brown spotting that began a couple weeks ago.  Reports had +HPT 4 days ago.  Denies abdominal cramping currently. States had abdominal cramping & lower back earlier today. LMP January 3. 2019.

## 2017-04-04 NOTE — Discharge Instructions (Signed)

## 2017-04-05 LAB — GC/CHLAMYDIA PROBE AMP (~~LOC~~) NOT AT ARMC
Chlamydia: NEGATIVE
NEISSERIA GONORRHEA: NEGATIVE

## 2017-05-06 LAB — OB RESULTS CONSOLE HIV ANTIBODY (ROUTINE TESTING): HIV: NONREACTIVE

## 2017-05-06 LAB — OB RESULTS CONSOLE ABO/RH: RH Type: POSITIVE

## 2017-05-06 LAB — OB RESULTS CONSOLE RPR: RPR: NONREACTIVE

## 2017-05-06 LAB — OB RESULTS CONSOLE GC/CHLAMYDIA
Chlamydia: NEGATIVE
Gonorrhea: NEGATIVE

## 2017-05-06 LAB — OB RESULTS CONSOLE RUBELLA ANTIBODY, IGM: Rubella: IMMUNE

## 2017-05-06 LAB — OB RESULTS CONSOLE HEPATITIS B SURFACE ANTIGEN: HEP B S AG: NEGATIVE

## 2017-05-06 LAB — OB RESULTS CONSOLE ANTIBODY SCREEN: ANTIBODY SCREEN: NEGATIVE

## 2017-08-24 ENCOUNTER — Encounter: Payer: BLUE CROSS/BLUE SHIELD | Admitting: Family Medicine

## 2017-09-02 ENCOUNTER — Encounter (HOSPITAL_COMMUNITY): Payer: Self-pay | Admitting: *Deleted

## 2017-09-02 ENCOUNTER — Inpatient Hospital Stay (HOSPITAL_COMMUNITY)
Admission: AD | Admit: 2017-09-02 | Discharge: 2017-09-02 | Disposition: A | Discharge status: Home / Self Care | Payer: BLUE CROSS/BLUE SHIELD | Source: Ambulatory Visit | Attending: Obstetrics and Gynecology | Admitting: Obstetrics and Gynecology

## 2017-09-02 DIAGNOSIS — N930 Postcoital and contact bleeding: Secondary | ICD-10-CM

## 2017-09-02 DIAGNOSIS — O4692 Antepartum hemorrhage, unspecified, second trimester: Secondary | ICD-10-CM | POA: Diagnosis present

## 2017-09-02 DIAGNOSIS — Z3A26 26 weeks gestation of pregnancy: Secondary | ICD-10-CM | POA: Insufficient documentation

## 2017-09-02 LAB — URINALYSIS, ROUTINE W REFLEX MICROSCOPIC
Bilirubin Urine: NEGATIVE
Glucose, UA: NEGATIVE mg/dL
Ketones, ur: NEGATIVE mg/dL
Leukocytes, UA: NEGATIVE
Nitrite: NEGATIVE
Protein, ur: NEGATIVE mg/dL
Specific Gravity, Urine: 1.006 (ref 1.005–1.030)
pH: 6 (ref 5.0–8.0)

## 2017-09-02 NOTE — MAU Provider Note (Signed)
Chief Complaint:  Vaginal Bleeding   First Provider Initiated Contact with Patient 09/02/17 0114      HPI: Kelly Harvey is a 37 y.o. G2P1000 at 60w3dwho presents to maternity admissions reporting vaginal bleeding. She reports vaginal bleeding that started occurring around 0100. She reports having intercourse at midnight then shortly after IC noticing pinkish red vaginal bleeding, she reports having to wear a pad for vaginal bleeding. Denies having to change pad or pad being completely soaked. She reports she had back pain and abdominal pain during intercourse but denies pain now. She reports good fetal movement, denies LOF. She denies complications during this pregnancy and has repeat C/S scheduled for 11/11.  Past Medical History: Past Medical History:  Diagnosis Date  . Allergy   . History of chicken pox   . Newborn product of in vitro fertilization (IVF) pregnancy     Past obstetric history: OB History  Gravida Para Term Preterm AB Living  2 1 1         SAB TAB Ectopic Multiple Live Births        0      # Outcome Date GA Lbr Len/2nd Weight Sex Delivery Anes PTL Lv  2 Current           1 Term 07/20/16 [redacted]w[redacted]d  2630 g M CS-LTranv Gen  LIV    Past Surgical History: Past Surgical History:  Procedure Laterality Date  . CESAREAN SECTION N/A 07/20/2016   Procedure: CESAREAN SECTION;  Surgeon: Janyth Contes, MD;  Location: [facility];  Service: Obstetrics;  Laterality: N/A;    Family History: Family History  Problem Relation Age of Onset  . Diabetes Mother   . Hyperlipidemia Mother   . Hypertension Mother   . Miscarriages / [place] Mother   . Hyperlipidemia Father   . Hypertension Father   . Healthy Brother   . Healthy Son   . Cancer Paternal Aunt        brain  . Healthy Brother   . Healthy Brother   . Healthy Brother     Social History: Social History   Tobacco Use  . Smoking status: Never Smoker  . Smokeless tobacco: Former Network engineer Use  Topics  . Alcohol use: No  . Drug use: No    Allergies: No Known Allergies  Meds:  Medications Prior to Admission  Medication Sig Dispense Refill Last Dose  . folic acid (FOLVITE) 240 MCG tablet Take 800 mcg by mouth daily.   09/01/2017 at Unknown time  . Prenatal Vit-Fe Fumarate-FA (MULTIVITAMIN-PRENATAL) 27-0.8 MG TABS tablet Take 1 tablet by mouth daily at 12 noon.   09/01/2017 at Unknown time  . Vitamin D, Ergocalciferol, (DRISDOL) 50000 units CAPS capsule Take 50,000 Units by mouth every 7 (seven) days.   09/01/2017 at Unknown time  . acetaminophen (TYLENOL) 500 MG tablet Take 1,000 mg by mouth every 6 (six) hours as needed for mild pain.   More than a month at Unknown time  . BIOTIN PO Take 1 each by mouth daily.    Past Week at Unknown time  . MINOXIDIL, TOPICAL, 5 % SOLN Apply 1 application topically 2 (two) times daily.   Past Week at Unknown time  . polyethylene glycol powder (GLYCOLAX/MIRALAX) powder Take 17 g by mouth daily. As needed for constipation 3350 g 1 Unknown at Unknown time    ROS:  Review of Systems  Constitutional: Negative.   Respiratory: Negative.   Cardiovascular: Negative.   Gastrointestinal: Negative.  Genitourinary: Positive for vaginal bleeding. Negative for difficulty urinating, dysuria, frequency, pelvic pain, urgency and vaginal pain.  Musculoskeletal: Negative.    I have reviewed patient's Past Medical Hx, Surgical Hx, Family Hx, Social Hx, medications and allergies.   Physical Exam   Patient Vitals for the past 24 hrs:  BP Temp Temp src Pulse Resp  09/02/17 0134 109/64 98.3 F (36.8 C) Oral 68 16  09/02/17 0100 117/68 98.3 F (36.8 C) - 69 -   Constitutional: Well-developed, well-nourished female in no acute distress.  Cardiovascular: normal rate Respiratory: normal effort GI: Abd soft, non-tender, gravid appropriate for gestational age. No contractions palpated  MS: Extremities nontender, no edema, normal ROM Neurologic: Alert and  oriented x 4.   PELVIC EXAM: Cervix pink, visually closed, without lesion, scant pinkish light red vaginal bleeding noted on cervix, vaginal walls and external genitalia normal CERVICAL EXAM:  Dilation: Closed Effacement (%): Thick Cervical Position: Posterior Exam by:: Wende Bushy CNM   FHT:  Baseline 135 , moderate variability, accelerations present, no decelerations Contractions: NONE   Labs: Results for orders placed or performed during the hospital encounter of 09/02/17 (from the past 24 hour(s))  Urinalysis, Routine w reflex microscopic     Status: Abnormal   Collection Time: 09/02/17 12:55 AM  Result Value Ref Range   Color, Urine YELLOW YELLOW   APPearance CLEAR CLEAR   Specific Gravity, Urine 1.006 1.005 - 1.030   pH 6.0 5.0 - 8.0   Glucose, UA NEGATIVE NEGATIVE mg/dL   Hgb urine dipstick LARGE (A) NEGATIVE   Bilirubin Urine NEGATIVE NEGATIVE   Ketones, ur NEGATIVE NEGATIVE mg/dL   Protein, ur NEGATIVE NEGATIVE mg/dL   Nitrite NEGATIVE NEGATIVE   Leukocytes, UA NEGATIVE NEGATIVE   RBC / HPF 11-20 0 - 5 RBC/hpf   WBC, UA 0-5 0 - 5 WBC/hpf   Bacteria, UA FEW (A) NONE SEEN   Squamous Epithelial / LPF 0-5 0 - 5   Mucus PRESENT    Sperm, UA PRESENT    MAU Course/MDM: Orders Placed This Encounter  Procedures  . Urinalysis, Routine w reflex microscopic   UA- negative  NST reviewed- reactive for gestational age  Consult Dr Melba Coon with presentation and exam findings. Okay to discharge home with follow up as scheduled   Pt discharge with strict bleeding precautions and pelvic rest.  Assessment: 1. Bleeding after intercourse   2. [redacted] weeks gestation of pregnancy     Plan: Discharge home Discussed reasons to return to MAU  Pelvic rest  Follow up as scheduled for prenatal appointments  Return to MAU as needed   [place], [facility]. Go on 09/10/2017.   Why:  Follow up as scheduled for prenatal appointments and return to MAU as  needed  Contact information: [place] [address] [place] [address] [phone]           Allergies as of 09/02/2017   No Known Allergies     Medication List    TAKE these medications   acetaminophen 500 MG tablet Commonly known as:  TYLENOL Take 1,000 mg by mouth every 6 (six) hours as needed for mild pain.   folic acid 081 MCG tablet Commonly known as:  FOLVITE Take 800 mcg by mouth daily.   multivitamin-prenatal 27-0.8 MG Tabs tablet Take 1 tablet by mouth daily at 12 noon.   polyethylene glycol powder powder Commonly known as:  GLYCOLAX/MIRALAX Take 17 g by mouth daily. As  needed for constipation   Vitamin D (Ergocalciferol) 50000 units Caps capsule Commonly known as:  DRISDOL Take 50,000 Units by mouth every 7 (seven) days.     ASK your doctor about these medications   BIOTIN PO Take 1 each by mouth daily.   MINOXIDIL (TOPICAL) 5 % Soln Apply 1 application topically 2 (two) times daily.       Darrol Poke Certified Nurse-Midwife 09/02/2017 1:34 AM

## 2017-09-02 NOTE — MAU Note (Signed)
Pt reports feeling baby movement.

## 2017-09-02 NOTE — MAU Note (Signed)
Pt presents to MAU c/o vaginal bleeding that started after intercourse. Pt states the bleeding started around 0000. Pt states the blood is a pinkish-or a dark red color. Pt currently has a pad on. Pt reports a decrease in movement since intercourse. Pt states during intercourse she had some lower abdominal pain but does not have the pain now. No LOF.

## 2017-11-12 ENCOUNTER — Telehealth (HOSPITAL_COMMUNITY): Payer: Self-pay | Admitting: *Deleted

## 2017-11-12 ENCOUNTER — Encounter (HOSPITAL_COMMUNITY): Payer: Self-pay

## 2017-11-12 NOTE — Telephone Encounter (Signed)
Preadmission screen  

## 2017-11-12 NOTE — Pre-Procedure Instructions (Signed)
Interpreter number [phone]

## 2017-11-15 ENCOUNTER — Telehealth (HOSPITAL_COMMUNITY): Payer: Self-pay | Admitting: *Deleted

## 2017-11-15 NOTE — Telephone Encounter (Signed)
Preadmission screen  

## 2017-11-15 NOTE — Pre-Procedure Instructions (Signed)
Interpreter number [phone]

## 2017-11-16 ENCOUNTER — Telehealth (HOSPITAL_COMMUNITY): Payer: Self-pay | Admitting: *Deleted

## 2017-11-16 NOTE — Pre-Procedure Instructions (Signed)
Interpreter number  [phone] Preadmission screen Message left with office

## 2017-11-16 NOTE — Telephone Encounter (Signed)
Preadmission screen  

## 2017-11-19 ENCOUNTER — Telehealth (HOSPITAL_COMMUNITY): Payer: Self-pay | Admitting: *Deleted

## 2017-11-19 NOTE — Telephone Encounter (Signed)
Preadmission screen  

## 2017-11-19 NOTE — Pre-Procedure Instructions (Signed)
interpreter number [phone]

## 2017-11-20 ENCOUNTER — Encounter (HOSPITAL_COMMUNITY): Payer: Self-pay | Admitting: Obstetrics and Gynecology

## 2017-11-20 DIAGNOSIS — O34219 Maternal care for unspecified type scar from previous cesarean delivery: Secondary | ICD-10-CM

## 2017-11-22 ENCOUNTER — Telehealth (HOSPITAL_COMMUNITY): Payer: Self-pay | Admitting: *Deleted

## 2017-11-22 NOTE — Telephone Encounter (Signed)
Preadmission screen  

## 2017-11-22 NOTE — Pre-Procedure Instructions (Signed)
Interpreter number [phone]

## 2017-11-23 NOTE — Pre-Procedure Instructions (Signed)
Interpreter number [phone] Preadmission screen

## 2017-11-24 NOTE — Patient Instructions (Signed)
Kelly Harvey  11/24/2017   Your procedure is scheduled on:  11/29/2017  Enter through the Main Entrance of [facility] [place] up the phone at the desk and dial [phone]  Call this number if you have problems the morning of [phone]  Remember:   Do not eat food:(After Midnight) Desps de medianoche.  Do not drink clear liquids: (After Midnight) Desps de medianoche.  Take these medicines the morning of surgery with A SIP OF WATER: none   Do not wear jewelry, make-up or nail polish.  Do not wear lotions, powders, or perfumes. Do not wear deodorant.  Do not shave 48 hours prior to surgery.  Do not bring valuables to the hospital.  [facility] is not   responsible for any belongings or valuables brought to the hospital.  Contacts, dentures or bridgework may not be worn into surgery.  Leave suitcase in the car. After surgery it may be brought to your room.  For patients admitted to the hospital, checkout time is 11:00 AM the day of              discharge.    N/A   Please read over the following fact sheets that you were given:   Surgical Site Infection Prevention

## 2017-11-26 ENCOUNTER — Encounter (HOSPITAL_COMMUNITY)
Admission: RE | Admit: 2017-11-26 | Discharge: 2017-11-26 | Disposition: A | Discharge status: Home / Self Care | Payer: Medicaid Other | Source: Ambulatory Visit | Attending: Obstetrics and Gynecology | Admitting: Obstetrics and Gynecology

## 2017-11-26 DIAGNOSIS — Z01812 Encounter for preprocedural laboratory examination: Secondary | ICD-10-CM | POA: Insufficient documentation

## 2017-11-26 LAB — TYPE AND SCREEN
ABO/RH(D): A POS
ANTIBODY SCREEN: NEGATIVE

## 2017-11-26 LAB — CBC
HEMATOCRIT: 35.4 % — AB (ref 36.0–46.0)
Hemoglobin: 12 g/dL (ref 12.0–15.0)
MCH: 31.3 pg (ref 26.0–34.0)
MCHC: 33.9 g/dL (ref 30.0–36.0)
MCV: 92.2 fL (ref 80.0–100.0)
Platelets: 253 10*3/uL (ref 150–400)
RBC: 3.84 MIL/uL — ABNORMAL LOW (ref 3.87–5.11)
RDW: 13.6 % (ref 11.5–15.5)
WBC: 10.6 10*3/uL — AB (ref 4.0–10.5)
nRBC: 0 % (ref 0.0–0.2)

## 2017-11-28 NOTE — H&P (Signed)
Ilayda Toda is a 37 y.o. female G3P1011 at 5 weeks for rLTCS.  First LTCS was stat due to fetal heart rate indication.  Also pt with Vit D deficiency, rosacea (on topical med as class B), AMA - negative panorama, female; h/o infertility with this conception being spontaneous.  Pt received Tdap and flu vaccines in [facility].  Also had flu - treated w tamiflu.  EDC by first trimester [place].  Normal genetic screening.    OB History    Gravida  2   Para  1   Term  1   Preterm      AB      Living  1     SAB      TAB      Ectopic      Multiple  0   Live Births  1         G1 SAB G2 LTCS at 67+6, 5#12 female Mohammed G3 present  No abn pap No STD  Past Medical History:  Diagnosis Date  . Allergy   . H/O cesarean section complicating pregnancy 93/08/1827  . History of chicken pox   . Newborn product of in vitro fertilization (IVF) pregnancy    Past Surgical History:  Procedure Laterality Date  . CESAREAN SECTION N/A 07/20/2016   Procedure: CESAREAN SECTION;  Surgeon: Janyth Contes, MD;  Location: [facility];  Service: Obstetrics;  Laterality: N/A;   Family History: family history includes Cancer in her paternal aunt; Diabetes in her mother; Healthy in her brother, brother, brother, brother, and son; Hyperlipidemia in her father and mother; Hypertension in her father and mother; Miscarriages / [place] in her mother. Social History:  reports that she has never smoked. She has quit using smokeless tobacco. She reports that she does not drink alcohol or use drugs. homemaker, married  Meds Vit D, PNV Iron, Folic Acid, (tamiflu)topical gel  All NKDA     Maternal Diabetes: No Genetic Screening: Normal Maternal Ultrasounds/Referrals: Normal Fetal Ultrasounds or other Referrals:  None Maternal Substance Abuse:  No Significant Maternal Medications:  None Significant Maternal Lab Results:  Lab values include: Group B Strep positive Other Comments:  Panorama low risk  female, nl glucola  Review of Systems  Constitutional: Negative.   HENT: Negative.   Eyes: Negative.   Respiratory: Negative.   Cardiovascular: Negative.   Gastrointestinal: Positive for heartburn.  Genitourinary: Negative.   Musculoskeletal: Positive for back pain.  Skin: Negative.   Neurological: Negative.   Psychiatric/Behavioral: Negative.    Maternal Medical History:  Contractions: Frequency: irregular.    Fetal activity: Perceived fetal activity is normal.    Prenatal complications: no prenatal complications H/o LTCS  Prenatal Complications - Diabetes: none.      Last menstrual period 01/21/2017, unknown if currently breastfeeding. Maternal Exam:  Uterine Assessment: Contraction frequency is irregular.   Abdomen: Patient reports no abdominal tenderness. Surgical scars: low transverse.   Fundal height is appropriate for gestation.   Estimated fetal weight is 7-8#.   Fetal presentation: vertex  Introitus: Normal vulva. Normal vagina.    Physical Exam  Constitutional: She is oriented to person, place, and time. She appears well-developed and well-nourished.  HENT:  Head: Normocephalic and atraumatic.  Cardiovascular: Normal rate and regular rhythm.  Respiratory: Effort normal and breath sounds normal. No respiratory distress. She has no wheezes.  GI: Soft. Bowel sounds are normal. She exhibits no distension. There is no tenderness.  Musculoskeletal: Normal range of motion.  Neurological: She is  alert and oriented to person, place, and time.  Skin: Skin is warm and dry.  Psychiatric: She has a normal mood and affect. Her behavior is normal.    Prenatal labs: ABO, Rh: --/--/A POS (11/08 1028) Antibody: NEG (11/08 1028) Rubella: Immune (04/18 0000) RPR: Nonreactive (04/18 0000)  HBsAg: Negative (04/18 0000)  HIV: Non-reactive (04/18 0000)  GBS:   positive  Tdap 09/10/17 Flu 09/27/17  Hgb 13.5/Plt 352, Ur Cx neg/ Chl neg/GC neg/ Varicella immune/Hgb electro  WNL/TSH WNL/first tri screen - nl NT, nl panorama female/AFP WNL/ glucola 126/  Nl anat, ant plac, female  Assessment/Plan: 37yo G3P1011 at 39wk for rLTCS D/w pt r/b/a of rLTCS Ancef for prophylaxis Will proceed  Kenyada Hy Bovard-Stuckert 11/28/2017, 3:41 PM

## 2017-11-29 ENCOUNTER — Encounter (HOSPITAL_COMMUNITY)
Admission: RE | Disposition: A | Discharge status: Home / Self Care | Payer: Self-pay | Source: Home / Self Care | Attending: Obstetrics and Gynecology

## 2017-11-29 ENCOUNTER — Encounter (HOSPITAL_COMMUNITY): Payer: Self-pay

## 2017-11-29 ENCOUNTER — Inpatient Hospital Stay (HOSPITAL_COMMUNITY)
Admission: RE | Admit: 2017-11-29 | Discharge: 2017-12-02 | DRG: 788 | Disposition: A | Discharge status: Home / Self Care | Payer: Medicaid Other | Attending: Obstetrics and Gynecology | Admitting: Obstetrics and Gynecology

## 2017-11-29 ENCOUNTER — Inpatient Hospital Stay (HOSPITAL_COMMUNITY): Payer: Medicaid Other

## 2017-11-29 DIAGNOSIS — Z87891 Personal history of nicotine dependence: Secondary | ICD-10-CM

## 2017-11-29 DIAGNOSIS — O99824 Streptococcus B carrier state complicating childbirth: Secondary | ICD-10-CM | POA: Diagnosis present

## 2017-11-29 DIAGNOSIS — Z3A39 39 weeks gestation of pregnancy: Secondary | ICD-10-CM

## 2017-11-29 DIAGNOSIS — O3413 Maternal care for benign tumor of corpus uteri, third trimester: Secondary | ICD-10-CM | POA: Diagnosis present

## 2017-11-29 DIAGNOSIS — O34211 Maternal care for low transverse scar from previous cesarean delivery: Secondary | ICD-10-CM | POA: Diagnosis present

## 2017-11-29 DIAGNOSIS — Z98891 History of uterine scar from previous surgery: Secondary | ICD-10-CM

## 2017-11-29 DIAGNOSIS — O34219 Maternal care for unspecified type scar from previous cesarean delivery: Secondary | ICD-10-CM

## 2017-11-29 DIAGNOSIS — D259 Leiomyoma of uterus, unspecified: Secondary | ICD-10-CM | POA: Diagnosis present

## 2017-11-29 SURGERY — Surgical Case
Anesthesia: Spinal

## 2017-11-29 MED ORDER — OXYTOCIN 10 UNIT/ML IJ SOLN
INTRAVENOUS | Status: DC | PRN
  Administered 2017-11-29: 40 [IU] via INTRAVENOUS

## 2017-11-29 MED ORDER — PRENATAL MULTIVITAMIN CH
1.0000 | ORAL_TABLET | Freq: Every day | ORAL | Status: DC
  Administered 2017-11-30 – 2017-12-02 (×3): 1 via ORAL
  Filled 2017-11-29 (×3): qty 1

## 2017-11-29 MED ORDER — WITCH HAZEL-GLYCERIN EX PADS: 1.0000 "application " | MEDICATED_PAD | CUTANEOUS | Status: DC | PRN

## 2017-11-29 MED ORDER — CEFAZOLIN SODIUM-DEXTROSE 2-4 GM/100ML-% IV SOLN
2.0000 g | INTRAVENOUS | Status: DC
  Filled 2017-11-29: qty 100

## 2017-11-29 MED ORDER — LACTATED RINGERS IV SOLN
INTRAVENOUS | Status: DC
  Administered 2017-11-30: 02:00:00 via INTRAVENOUS

## 2017-11-29 MED ORDER — SODIUM CHLORIDE 0.9% FLUSH: 3.0000 mL | INTRAVENOUS | Status: DC | PRN

## 2017-11-29 MED ORDER — DIPHENHYDRAMINE HCL 50 MG/ML IJ SOLN: 12.5000 mg | INTRAMUSCULAR | Status: DC | PRN

## 2017-11-29 MED ORDER — OXYTOCIN 10 UNIT/ML IJ SOLN
INTRAMUSCULAR | Status: AC
  Filled 2017-11-29: qty 4

## 2017-11-29 MED ORDER — LACTATED RINGERS IV SOLN
INTRAVENOUS | Status: DC
  Administered 2017-11-29: 08:00:00 via INTRAVENOUS

## 2017-11-29 MED ORDER — DEXAMETHASONE SODIUM PHOSPHATE 4 MG/ML IJ SOLN
INTRAMUSCULAR | Status: DC | PRN
  Administered 2017-11-29: 4 mg via INTRAVENOUS

## 2017-11-29 MED ORDER — DIPHENHYDRAMINE HCL 25 MG PO CAPS
25.0000 mg | ORAL_CAPSULE | ORAL | Status: DC | PRN
  Filled 2017-11-29: qty 1

## 2017-11-29 MED ORDER — NALOXONE HCL 4 MG/10ML IJ SOLN
1.0000 ug/kg/h | INTRAVENOUS | Status: DC | PRN
  Filled 2017-11-29: qty 5

## 2017-11-29 MED ORDER — ONDANSETRON HCL 4 MG/2ML IJ SOLN
INTRAMUSCULAR | Status: AC
  Filled 2017-11-29: qty 2

## 2017-11-29 MED ORDER — NALBUPHINE HCL 10 MG/ML IJ SOLN: 5.0000 mg | INTRAMUSCULAR | Status: DC | PRN

## 2017-11-29 MED ORDER — ONDANSETRON HCL 4 MG/2ML IJ SOLN: 4.0000 mg | Freq: Three times a day (TID) | INTRAMUSCULAR | Status: DC | PRN

## 2017-11-29 MED ORDER — KETOROLAC TROMETHAMINE 30 MG/ML IJ SOLN
30.0000 mg | Freq: Once | INTRAMUSCULAR | Status: AC | PRN
  Administered 2017-11-29: 30 mg via INTRAVENOUS

## 2017-11-29 MED ORDER — SENNOSIDES-DOCUSATE SODIUM 8.6-50 MG PO TABS
2.0000 | ORAL_TABLET | ORAL | Status: DC
  Administered 2017-11-30 – 2017-12-01 (×4): 2 via ORAL
  Filled 2017-11-29 (×3): qty 2

## 2017-11-29 MED ORDER — SCOPOLAMINE 1 MG/3DAYS TD PT72
MEDICATED_PATCH | TRANSDERMAL | Status: AC
  Filled 2017-11-29: qty 1

## 2017-11-29 MED ORDER — MORPHINE SULFATE (PF) 0.5 MG/ML IJ SOLN
INTRAMUSCULAR | Status: DC | PRN
  Administered 2017-11-29: .15 mg via INTRATHECAL

## 2017-11-29 MED ORDER — NALBUPHINE HCL 10 MG/ML IJ SOLN: 5.0000 mg | Freq: Once | INTRAMUSCULAR | Status: DC | PRN

## 2017-11-29 MED ORDER — COCONUT OIL OIL
1.0000 "application " | TOPICAL_OIL | Status: DC | PRN
  Filled 2017-11-29: qty 120

## 2017-11-29 MED ORDER — ONDANSETRON HCL 4 MG/2ML IJ SOLN
INTRAMUSCULAR | Status: DC | PRN
  Administered 2017-11-29: 4 mg via INTRAVENOUS

## 2017-11-29 MED ORDER — PHENYLEPHRINE 8 MG IN D5W 100 ML (0.08MG/ML) PREMIX OPTIME
INJECTION | INTRAVENOUS | Status: DC | PRN
  Administered 2017-11-29: 60 ug/min via INTRAVENOUS
  Administered 2017-11-29: 80 ug/min via INTRAVENOUS

## 2017-11-29 MED ORDER — MORPHINE SULFATE (PF) 0.5 MG/ML IJ SOLN
INTRAMUSCULAR | Status: AC
  Filled 2017-11-29: qty 10

## 2017-11-29 MED ORDER — ERYTHROMYCIN 5 MG/GM OP OINT
TOPICAL_OINTMENT | OPHTHALMIC | Status: AC
  Filled 2017-11-29: qty 1

## 2017-11-29 MED ORDER — OXYCODONE HCL 5 MG PO TABS: 5.0000 mg | ORAL_TABLET | ORAL | Status: DC | PRN

## 2017-11-29 MED ORDER — MENTHOL 3 MG MT LOZG: 1.0000 | LOZENGE | OROMUCOSAL | Status: DC | PRN

## 2017-11-29 MED ORDER — GENTAMICIN SULFATE 40 MG/ML IJ SOLN
INTRAVENOUS | Status: DC | PRN
  Administered 2017-11-29: 115 mL via INTRAVENOUS

## 2017-11-29 MED ORDER — SIMETHICONE 80 MG PO CHEW
80.0000 mg | CHEWABLE_TABLET | ORAL | Status: DC
  Administered 2017-11-30 – 2017-12-01 (×3): 80 mg via ORAL
  Filled 2017-11-29 (×3): qty 1

## 2017-11-29 MED ORDER — OXYTOCIN 40 UNITS IN LACTATED RINGERS INFUSION - SIMPLE MED: 2.5000 [IU]/h | INTRAVENOUS | Status: AC

## 2017-11-29 MED ORDER — MEPERIDINE HCL 25 MG/ML IJ SOLN: 6.2500 mg | INTRAMUSCULAR | Status: DC | PRN

## 2017-11-29 MED ORDER — KETOROLAC TROMETHAMINE 30 MG/ML IJ SOLN
INTRAMUSCULAR | Status: AC
  Filled 2017-11-29: qty 1

## 2017-11-29 MED ORDER — SCOPOLAMINE 1 MG/3DAYS TD PT72
1.0000 | MEDICATED_PATCH | Freq: Once | TRANSDERMAL | Status: AC
  Administered 2017-11-29: 1.5 mg via TRANSDERMAL

## 2017-11-29 MED ORDER — FENTANYL CITRATE (PF) 100 MCG/2ML IJ SOLN
INTRAMUSCULAR | Status: DC | PRN
  Administered 2017-11-29: 15 ug via INTRATHECAL

## 2017-11-29 MED ORDER — DIPHENHYDRAMINE HCL 25 MG PO CAPS: 25.0000 mg | ORAL_CAPSULE | Freq: Four times a day (QID) | ORAL | Status: DC | PRN

## 2017-11-29 MED ORDER — FENTANYL CITRATE (PF) 100 MCG/2ML IJ SOLN
INTRAMUSCULAR | Status: AC
  Filled 2017-11-29: qty 2

## 2017-11-29 MED ORDER — OXYCODONE HCL 5 MG PO TABS: 10.0000 mg | ORAL_TABLET | ORAL | Status: DC | PRN

## 2017-11-29 MED ORDER — DEXAMETHASONE SODIUM PHOSPHATE 4 MG/ML IJ SOLN
INTRAMUSCULAR | Status: AC
  Filled 2017-11-29: qty 1

## 2017-11-29 MED ORDER — SODIUM CHLORIDE 0.9 % IR SOLN
Status: DC | PRN
  Administered 2017-11-29: 1

## 2017-11-29 MED ORDER — SIMETHICONE 80 MG PO CHEW: 80.0000 mg | CHEWABLE_TABLET | ORAL | Status: DC | PRN

## 2017-11-29 MED ORDER — NALOXONE HCL 0.4 MG/ML IJ SOLN: 0.4000 mg | INTRAMUSCULAR | Status: DC | PRN

## 2017-11-29 MED ORDER — DIBUCAINE 1 % RE OINT: 1.0000 "application " | TOPICAL_OINTMENT | RECTAL | Status: DC | PRN

## 2017-11-29 MED ORDER — DEXTROSE 5 % IV SOLN: INTRAVENOUS | Status: DC | PRN

## 2017-11-29 MED ORDER — BUPIVACAINE IN DEXTROSE 0.75-8.25 % IT SOLN
INTRATHECAL | Status: DC | PRN
  Administered 2017-11-29: 1.6 mL via INTRATHECAL

## 2017-11-29 MED ORDER — PROMETHAZINE HCL 25 MG/ML IJ SOLN: 6.2500 mg | INTRAMUSCULAR | Status: DC | PRN

## 2017-11-29 MED ORDER — STERILE WATER FOR IRRIGATION IR SOLN
Status: DC | PRN
  Administered 2017-11-29: 1000 mL

## 2017-11-29 MED ORDER — EPHEDRINE SULFATE-NACL 50-0.9 MG/10ML-% IV SOSY
PREFILLED_SYRINGE | INTRAVENOUS | Status: DC | PRN
  Administered 2017-11-29 (×2): 10 mg via INTRAVENOUS

## 2017-11-29 MED ORDER — IBUPROFEN 800 MG PO TABS
800.0000 mg | ORAL_TABLET | Freq: Three times a day (TID) | ORAL | Status: DC
  Administered 2017-11-29 – 2017-12-02 (×10): 800 mg via ORAL
  Filled 2017-11-29 (×11): qty 1

## 2017-11-29 MED ORDER — ZOLPIDEM TARTRATE 5 MG PO TABS
5.0000 mg | ORAL_TABLET | Freq: Every evening | ORAL | Status: DC | PRN
  Administered 2017-11-30: 5 mg via ORAL
  Filled 2017-11-29: qty 1

## 2017-11-29 MED ORDER — OXYCODONE HCL 5 MG/5ML PO SOLN: 5.0000 mg | Freq: Once | ORAL | Status: DC | PRN

## 2017-11-29 MED ORDER — OXYCODONE HCL 5 MG PO TABS: 5.0000 mg | ORAL_TABLET | Freq: Once | ORAL | Status: DC | PRN

## 2017-11-29 MED ORDER — ACETAMINOPHEN 325 MG PO TABS
650.0000 mg | ORAL_TABLET | ORAL | Status: DC | PRN
  Administered 2017-11-30 – 2017-12-01 (×4): 650 mg via ORAL
  Filled 2017-11-29 (×4): qty 2

## 2017-11-29 MED ORDER — SIMETHICONE 80 MG PO CHEW
80.0000 mg | CHEWABLE_TABLET | Freq: Three times a day (TID) | ORAL | Status: DC
  Administered 2017-11-29 – 2017-12-02 (×8): 80 mg via ORAL
  Filled 2017-11-29 (×10): qty 1

## 2017-11-29 MED ORDER — GENTAMICIN SULFATE 40 MG/ML IJ SOLN: INTRAVENOUS | Status: DC | PRN

## 2017-11-29 MED ORDER — HYDROMORPHONE HCL 1 MG/ML IJ SOLN: 0.2500 mg | INTRAMUSCULAR | Status: DC | PRN

## 2017-11-29 SURGICAL SUPPLY — 37 items
BENZOIN TINCTURE PRP APPL 2/3 (GAUZE/BANDAGES/DRESSINGS) ×3 IMPLANT
CHLORAPREP W/TINT 26ML (MISCELLANEOUS) ×3 IMPLANT
CLAMP CORD UMBIL (MISCELLANEOUS) IMPLANT
CLOSURE STERI STRIP 1/2 X4 (GAUZE/BANDAGES/DRESSINGS) ×2 IMPLANT
CLOSURE WOUND 1/2 X4 (GAUZE/BANDAGES/DRESSINGS) ×1
CLOTH BEACON ORANGE TIMEOUT ST (SAFETY) ×3 IMPLANT
DRSG OPSITE POSTOP 4X10 (GAUZE/BANDAGES/DRESSINGS) ×3 IMPLANT
ELECT REM PT RETURN 9FT ADLT (ELECTROSURGICAL) ×3
ELECTRODE REM PT RTRN 9FT ADLT (ELECTROSURGICAL) ×1 IMPLANT
EXTRACTOR VACUUM M CUP 4 TUBE (SUCTIONS) ×2 IMPLANT
EXTRACTOR VACUUM M CUP 4' TUBE (SUCTIONS) ×1
GLOVE BIO SURGEON STRL SZ 6.5 (GLOVE) ×2 IMPLANT
GLOVE BIO SURGEONS STRL SZ 6.5 (GLOVE) ×1
GLOVE BIOGEL PI IND STRL 7.0 (GLOVE) ×1 IMPLANT
GLOVE BIOGEL PI INDICATOR 7.0 (GLOVE) ×2
GOWN STRL REUS W/TWL LRG LVL3 (GOWN DISPOSABLE) ×6 IMPLANT
KIT ABG SYR 3ML LUER SLIP (SYRINGE) IMPLANT
NEEDLE HYPO 25X5/8 SAFETYGLIDE (NEEDLE) IMPLANT
NS IRRIG 1000ML POUR BTL (IV SOLUTION) ×3 IMPLANT
PACK C SECTION WH (CUSTOM PROCEDURE TRAY) ×3 IMPLANT
PAD OB MATERNITY 4.3X12.25 (PERSONAL CARE ITEMS) ×3 IMPLANT
PENCIL SMOKE EVAC W/HOLSTER (ELECTROSURGICAL) ×3 IMPLANT
RTRCTR C-SECT PINK 25CM LRG (MISCELLANEOUS) ×3 IMPLANT
SPONGE LAP 18X18 RF (DISPOSABLE) ×12 IMPLANT
STRIP CLOSURE SKIN 1/2X4 (GAUZE/BANDAGES/DRESSINGS) ×2 IMPLANT
SUT MNCRL 0 VIOLET CTX 36 (SUTURE) ×2 IMPLANT
SUT MONOCRYL 0 CTX 36 (SUTURE) ×4
SUT PLAIN 1 NONE 54 (SUTURE) IMPLANT
SUT PLAIN 2 0 XLH (SUTURE) ×3 IMPLANT
SUT VIC AB 0 CT1 27 (SUTURE) ×4
SUT VIC AB 0 CT1 27XBRD ANBCTR (SUTURE) ×2 IMPLANT
SUT VIC AB 2-0 CT1 27 (SUTURE) ×2
SUT VIC AB 2-0 CT1 TAPERPNT 27 (SUTURE) ×1 IMPLANT
SUT VIC AB 4-0 KS 27 (SUTURE) ×3 IMPLANT
SYR BULB IRRIGATION 50ML (SYRINGE) ×3 IMPLANT
TOWEL OR 17X24 6PK STRL BLUE (TOWEL DISPOSABLE) ×3 IMPLANT
TRAY FOLEY W/BAG SLVR 14FR LF (SET/KITS/TRAYS/PACK) ×3 IMPLANT

## 2017-11-29 NOTE — Transfer of Care (Signed)
Immediate Anesthesia Transfer of Care Note  Patient: Kelly Harvey  Procedure(s) Performed: REPEAT CESAREAN SECTION (N/A )  Patient Location: PACU  Anesthesia Type:Spinal  Level of Consciousness: awake, alert , oriented and patient cooperative  Airway & Oxygen Therapy: Patient Spontanous Breathing  Post-op Assessment: Report given to RN and Post -op Vital signs reviewed and stable  Post vital signs: Reviewed and stable  Last Vitals:  Vitals Value Taken Time  BP    Temp    Pulse    Resp 7 11/29/2017 10:42 AM  SpO2    Vitals shown include unvalidated device data.  Last Pain:  Vitals:   11/29/17 0813  TempSrc: Oral         Complications: No apparent anesthesia complications

## 2017-11-29 NOTE — Brief Op Note (Signed)
11/29/2017  10:45 AM  PATIENT:  Earnestine Leys  37 y.o. female  PRE-OPERATIVE DIAGNOSIS:  repeat c-section  POST-OPERATIVE DIAGNOSIS:  repeat c-section   PROCEDURE:  Procedure(s) with comments: REPEAT CESAREAN SECTION (N/A) - Heather,  RNFA  SURGEON:  Surgeon(s) and Role:    * Bovard-Stuckert, Deshayla Empson, MD - Primary  ASSISTANTS: Water engineer, Heather RNFA   ANESTHESIA:   spinal  EBL:  348 mL IVF and uop per anesthesia, clear urine  FINDINGS: viable female infant at 9:50am, apgars 8/9, wt unofficial 5#9; nl uterus sm ant fibroid (1cmx2cm subserosal), nl tubes and B ovaries   BLOOD ADMINISTERED:none  DRAINS: Urinary Catheter (Foley)   LOCAL MEDICATIONS USED:  NONE  SPECIMEN:  Source of Specimen:  Placenta  DISPOSITION OF SPECIMEN:  L&D  COUNTS:  YES  TOURNIQUET:  * No tourniquets in log *  DICTATION: .Other Dictation: Dictation Number [id number]  PLAN OF CARE: Admit to inpatient   PATIENT DISPOSITION:  PACU - hemodynamically stable.   Delay start of Pharmacological VTE agent (>24hrs) due to surgical blood loss or risk of bleeding: not applicable

## 2017-11-29 NOTE — Anesthesia Postprocedure Evaluation (Signed)
Anesthesia Post Note  Patient: Michon Rampersad  Procedure(s) Performed: REPEAT CESAREAN SECTION (N/A )     Patient location during evaluation: PACU Anesthesia Type: Spinal Level of consciousness: oriented and awake and alert Pain management: pain level controlled Vital Signs Assessment: post-procedure vital signs reviewed and stable Respiratory status: spontaneous breathing and respiratory function stable Cardiovascular status: blood pressure returned to baseline and stable Postop Assessment: no headache, no backache and no apparent nausea or vomiting Anesthetic complications: no    Last Vitals:  Vitals:   11/29/17 1145 11/29/17 1203  BP: 108/61 101/67  Pulse: 60 61  Resp: 13 14  Temp: 36.4 C 36.4 C  SpO2: 100% 100%    Last Pain:  Vitals:   11/29/17 1203  TempSrc: Axillary   Pain Goal:                 Lynda Rainwater

## 2017-11-29 NOTE — Anesthesia Preprocedure Evaluation (Signed)
Anesthesia Evaluation  Patient identified by MRN, date of birth, ID band Patient awake    Reviewed: Allergy & Precautions, NPO status , Patient's Chart, lab work & pertinent test resultsPreop documentation limited or incomplete due to emergent nature of procedure.  Airway Mallampati: II  TM Distance: >3 FB Neck ROM: Full    Dental no notable dental hx. (+) Teeth Intact   Pulmonary neg pulmonary ROS,    Pulmonary exam normal breath sounds clear to auscultation       Cardiovascular negative cardio ROS Normal cardiovascular exam Rhythm:Regular Rate:Normal     Neuro/Psych negative neurological ROS  negative psych ROS   GI/Hepatic negative GI ROS, Neg liver ROS,   Endo/Other  negative endocrine ROS  Renal/GU negative Renal ROS  negative genitourinary   Musculoskeletal negative musculoskeletal ROS (+)   Abdominal   Peds  Hematology negative hematology ROS (+)   Anesthesia Other Findings   Reproductive/Obstetrics (+) Pregnancy 37 6/7 weeks Non reactive NST Non reassuring FHR tracing Hx/o IVF                             Anesthesia Physical  Anesthesia Plan  ASA: II  Anesthesia Plan: Spinal   Post-op Pain Management:    Induction:   PONV Risk Score and Plan: 2 and Treatment may vary due to age or medical condition  Airway Management Planned: Natural Airway  Additional Equipment: None  Intra-op Plan:   Post-operative Plan:   Informed Consent: I have reviewed the patients History and Physical, chart, labs and discussed the procedure including the risks, benefits and alternatives for the proposed anesthesia with the patient or authorized representative who has indicated his/her understanding and acceptance.   Dental advisory given  Plan Discussed with: CRNA  Anesthesia Plan Comments:         Anesthesia Quick Evaluation

## 2017-11-29 NOTE — Anesthesia Procedure Notes (Addendum)
Spinal  Patient location during procedure: OR Start time: 11/29/2017 9:26 AM End time: 11/29/2017 9:31 AM Staffing Anesthesiologist: Lynda Rainwater, MD Other anesthesia staff: Sheryle Spray, RN Performed: other anesthesia staff  Preanesthetic Checklist Completed: patient identified, site marked, surgical consent, pre-op evaluation, timeout performed, IV checked, risks and benefits discussed and monitors and equipment checked Spinal Block Patient position: sitting Prep: ChloraPrep Patient monitoring: heart rate, continuous pulse ox and blood pressure Approach: midline Location: L4-5 Injection technique: single-shot Needle Needle type: Pencan  Needle gauge: 24 G Needle length: 9 cm Assessment Sensory level: T4

## 2017-11-29 NOTE — Anesthesia Postprocedure Evaluation (Signed)
Anesthesia Post Note  Patient: Kelly Harvey  Procedure(s) Performed: REPEAT CESAREAN SECTION (N/A )     Patient location during evaluation: Mother Baby Anesthesia Type: Spinal Level of consciousness: awake Pain management: satisfactory to patient Vital Signs Assessment: post-procedure vital signs reviewed and stable Respiratory status: spontaneous breathing Cardiovascular status: stable Anesthetic complications: no    Last Vitals:  Vitals:   11/29/17 1300 11/29/17 1420  BP: 97/64 107/70  Pulse: (!) 57 69  Resp: 18 18  Temp: 36.7 C (!) 36.4 C  SpO2: 98% 100%    Last Pain:  Vitals:   11/29/17 1420  TempSrc: Oral   Pain Goal:                 [employer]

## 2017-11-29 NOTE — Addendum Note (Signed)
Addendum  created 11/29/17 1558 by Asher Muir, CRNA   Sign clinical note

## 2017-11-29 NOTE — Op Note (Signed)
NAMEJETAUN, COLBATH MEDICAL RECORD [id number] ACCOUNT [id number] DATE OF BIRTH:1980-12-22 FACILITY: [facility] LOCATION: [id number] PHYSICIAN:Geo Slone BOVARD-STUCKERT, MD  OPERATIVE REPORT  DATE OF PROCEDURE:  11/29/2017  PREOPERATIVE DIAGNOSES:  Intrauterine pregnancy at term, history of low transverse cesarean section, desires repeat cesarean section.  POSTOPERATIVE DIAGNOSES:  Intrauterine pregnancy at term, history of low transverse cesarean section, desires repeat cesarean section, delivered.  PROCEDURE:  Repeat low transverse cesarean section.  SURGEON:  Janyth Contes, MD  ASSISTANT:   Alfonso Patten, RNFA  ANESTHESIA:  Spinal.  IV FLUIDS AND URINE OUTPUT:  Per anesthesia.  Clear urine at the end of the procedure per anesthesia.  ESTIMATED BLOOD LOSS:  Approximately 348 mL.  FINDINGS:  Viable female infant at 9:50 a.m. with Apgars of 8 at one minute and 9 at five minutes and a weight pending.  Normal uterus with small 1 cm x 2 cm subserosal anterior fibroid, normal tubes and bilateral ovaries.  COMPLICATIONS:  None.  PATHOLOGY:  Placenta to labor and delivery.  DESCRIPTION OF PROCEDURE:  After informed consent was reviewed with the patient and her husband, she was transported to the OR where spinal anesthesia was placed and found to be adequate.  She was then prepped and draped in the normal sterile fashion and  returned to the supine position with a leftward tilt.  The Foley catheter was sterilely placed.  A Pfannenstiel skin incision was made at the level of her previous incision and carried through to the underlying layer of fascia sharply.  The fascia was  incised in the midline.  The incision was extended laterally with Mayo scissors.  Superior aspect of the fascial incision was grasped with Kocher clamps, elevated and the rectus muscles were dissected off both bluntly and sharply.  The peritoneum was  entered bluntly, and the incision was extended superiorly and  inferiorly with good visualization of the bladder.  An Alexis skin retractor was placed carefully, making sure that no bowel was entrapped.  The uterus was explored.  A bladder flap was  created.  The uterus was incised in a transverse fashion.  The infant was delivered from a vertex presentation with the aid of a vacuum.  Nose and mouth were suctioned on the field.  Cord was clamped and cut after waiting a minute.  The placenta was  extracted from the uterus.  The uterus was cleared of all clot and debris.  Uterine incision was closed with 2 layers of 0 Monocryl, the first of which was running locked and the second as an imbricating layer.  The gutters were cleared of all clot and  debris.  The peritoneum was reapproximated using 2-0 Vicryl in a running fashion.  The fascia was closed from either corner overlapping in the midline with 0 Vicryl.  The subcuticular adipose layer was made hemostatic with Bovie cautery.  The dead space  was closed with plain gut.  The skin was closed with 4-0 Vicryl in a subcuticular fashion.  Benzoin and Steri-Strips were applied.  The patient tolerated the procedure well.  Sponge, lap and needle counts were correct x2 per the operating room staff.  LN/NUANCE  D:11/29/2017 T:11/29/2017 JOB:[id number]

## 2017-11-29 NOTE — Lactation Note (Signed)
This note was copied from a baby's chart. Lactation Consultation Note  Patient Name: Kelly Harvey [id number] Date: 11/29/2017 Reason for consult: Initial assessment;Infant < 6lbs;Term  Used [place] interpreter services for Arabic.Tarek # [id number]  33 hours old FT < 6 lbs female who is being exclusively BF by his mother, she's a P2 and somehow experienced BF. She voiced she had a really bad experience BF her first child, baby was in the NICU for 3 weeks and she had to exclusively pump and bottle for 5 months, and she didn't like it, she said "it hurts". Mom doesn't have a pump at home, and when [facility] offered to set up a DEBP (or a hand pump to take home) mom refused stating she doesn't want to pump. Explained to mom that due to baby's birth weight he may have to be supplemented at some point and that mother's milk is always going to be our first choice for supplementation. If none is available, we may have to use some formula if baby is still not latching to the breast, mom voiced baby has been very sleepy. LC also discussed the consequences for mom of not emptying the breast (engorgement).  Mom had baby latched on to the breast when entering the room, he was also swaddled with two blankets, and fell asleep at the breast. Asked mom if it's OK to put baby STS to the breast and she agreed to wake him up to feed. Baby had a stool, changed his diaper and LC put him back to mom's breast STS in football position. He only did a few sucks before falling back to sleep. Asked mom to call for assistance when needed. Per mom feedings at the breast are comfortable, both of her nipples looked intact. When doing hand expression, she was able to get a few droplets of colostrum, LC showed mom how to finger feed baby.  Feeding plan:  1. Encouraged mom to feed baby STS every 3 hours or sooner if feeding cues are present 2. Hand expression and spoon/finger feeding was also encouraged since mom does not want to pump  BF  brochure, BF resources and feeding diary were reviewed. Mom reported all questions and concerns were answered, she's aware of [place] services and will call PRN.  Maternal Data Formula Feeding for Exclusion: Yes Reason for exclusion: Mother's choice to formula and breast feed on admission Has patient been taught Hand Expression?: Yes Does the patient have breastfeeding experience prior to this delivery?: Yes  Feeding Feeding Type: Breast Fed  LATCH Score Latch: Too sleepy or reluctant, no latch achieved, no sucking elicited.  Audible Swallowing: None  Type of Nipple: Everted at rest and after stimulation  Comfort (Breast/Nipple): Soft / non-tender  Hold (Positioning): Assistance needed to correctly position infant at breast and maintain latch.  LATCH Score: 5  Interventions Interventions: Breast feeding basics reviewed;Assisted with latch;Skin to skin;Breast massage;Hand express;Breast compression;Adjust position;Support pillows  Lactation Tools Discussed/Used WIC Program: Yes   Consult Status Consult Status: Follow-up Date: 11/30/17 Follow-up type: In-patient    Kelly Harvey Kelly Harvey 11/29/2017, 11:23 PM

## 2017-11-30 LAB — CBC
HEMATOCRIT: 25.8 % — AB (ref 36.0–46.0)
Hemoglobin: 9 g/dL — ABNORMAL LOW (ref 12.0–15.0)
MCH: 32.1 pg (ref 26.0–34.0)
MCHC: 34.9 g/dL (ref 30.0–36.0)
MCV: 92.1 fL (ref 80.0–100.0)
NRBC: 0 % (ref 0.0–0.2)
Platelets: 202 10*3/uL (ref 150–400)
RBC: 2.8 MIL/uL — AB (ref 3.87–5.11)
RDW: 13.6 % (ref 11.5–15.5)
WBC: 11.9 10*3/uL — AB (ref 4.0–10.5)

## 2017-11-30 LAB — RPR: RPR Ser Ql: NONREACTIVE

## 2017-11-30 NOTE — Lactation Note (Signed)
This note was copied from a baby's chart. Lactation Consultation Note Baby 53 hrs old BF when [place] entered rm. Baby swaddled, needed positioning adjusted. LC widened flange. Mom denied painful latches.  Discussed body alignment and positioning.  Occasional swallows heard. Mom has compressible breast. Hand expression w/1 ml colostrum collected. Mom exclusively pump and bottle fed BM for 5 months. Baby was in NICU for 3 weeks. [name] is now 33 months old.  Baby 5.10 lbs. Discussed after BF, supplementing baby w/BM, then if needed 22 cal. Formula. Reviewed supplementing amount and care for babies under 6 lbs. Mom states understanding w/verbal teach back. Mom understands until milk supply established. Gave 1 ml colostrum via gloved finger for suck training. Baby bites, tongue thrust, took a while to get baby to suckle on gloved finger.  Gave 7 ml Neosure via curve tip syring. Discussed BF, giving BM first then what is needed according to hours of age. Mom verbalized understanding. Newborn behavior, STS, I&O, breast massage, supply and demand discussed. Discussed using DEBP since baby less than 6 lbs and supplementing 5-6 times a day. Kit in rm. Mom wanting to go to sleep now since baby is sleeping. Will ask RN to set up. Mom encouraged to feed baby 8-12 times/24 hours and with feeding cues.  Encouraged mom to call for assistance or questions.  [place] brochure given w/resources, support groups and [place] services.  Patient Name: Kelly Harvey [id number] Date: 11/30/2017 Reason for consult: Initial assessment;Infant < 6lbs   Maternal Data    Feeding Feeding Type: Formula  LATCH Score Latch: Grasps breast easily, tongue down, lips flanged, rhythmical sucking.  Audible Swallowing: A few with stimulation  Type of Nipple: Everted at rest and after stimulation  Comfort (Breast/Nipple): Soft / non-tender  Hold (Positioning): Assistance needed to correctly position infant at breast and maintain  latch.  LATCH Score: 8  Interventions Interventions: Breast feeding basics reviewed;Support pillows;Assisted with latch;Position options;Breast massage;Expressed milk;Hand express;Breast compression;Adjust position  Lactation Tools Discussed/Used Pump Review: Milk Storage   Consult Status Consult Status: Follow-up Date: 12/01/17 Follow-up type: In-patient    Theodoro Kalata 11/30/2017, 2:32 AM

## 2017-11-30 NOTE — Progress Notes (Signed)
Subjective: Postpartum Day 1: Cesarean Delivery Patient reports incisional pain, tolerating PO and no problems voiding.    Objective: Vital signs in last 24 hours: Temp:  [97.5 F (36.4 C)-98.3 F (36.8 C)] 97.7 F (36.5 C) (11/12 0540) Pulse Rate:  [53-82] 53 (11/12 0540) Resp:  [12-20] 18 (11/12 0540) BP: (97-136)/(51-78) 98/63 (11/12 0540) SpO2:  [98 %-100 %] 98 % (11/12 0540) Weight:  [88 kg] 88 kg (11/11 0803)  Physical Exam:  General: alert Lochia: appropriate Uterine Fundus: firm Incision: dressing C/D/I   Recent Labs    11/30/17 0511  HGB 9.0*  HCT 25.8*    Assessment/Plan: Status post Cesarean section. Doing well postoperatively.  Continue current care, ambulate.  Kelly Harvey 11/30/2017, 8:00 AM

## 2017-12-01 NOTE — Progress Notes (Addendum)
Subjective: Postpartum Day 2: Cesarean Delivery Patient reports incisional pain, tolerating PO and no problems voiding.    Objective: Vital signs in last 24 hours: Temp:  [97.5 F (36.4 C)-98.1 F (36.7 C)] 97.5 F (36.4 C) (11/13 0559) Pulse Rate:  [52-62] 52 (11/13 0559) Resp:  [16-18] 18 (11/13 0559) BP: (98-117)/(51-65) 117/62 (11/13 0559) SpO2:  [99 %-100 %] 100 % (11/13 0559)  Physical Exam:  General: alert and no distress Lochia: appropriate Uterine Fundus: firm Incision: healing well DVT Evaluation: No evidence of DVT seen on physical exam.  Recent Labs    11/30/17 0511  HGB 9.0*  HCT 25.8*    Assessment/Plan: Status post Cesarean section. Doing well postoperatively.  Continue current care. Desires abdominal binder Will order Vitamin D per pt request  Kelly Harvey 12/01/2017, 8:08 AM

## 2017-12-01 NOTE — Lactation Note (Signed)
This note was copied from a baby's chart. Lactation Consultation Note  Patient Name: Kelly Harvey [id number]'[id number] Date: 12/01/2017 Reason for consult: Follow-up assessment;Term P1, 38 hour female infant, weight loss -8%. LC entered room, mom, family friend , grandmother and infant present. [place] asked mom if she need any assistance with breastfeeding. Before mom could answer family friend said no! She just finished BF infant for 10 minutes. Family friend preceded to give infant Similac Neosure 22kcal slow flow bottle nipple 15-20 ml. LC ask mom if she was pumping, family friend answered for mom no!, she doesn't  like pumping she had bad experience with her other child that was in NICU. She will not be pumping. [place] asked if mom would like to hand express and she could give infant back her EBM, family friend said , she would help her with hand expression  they do not need help with anything. Mom did not have opportunity to answer any questions because family friend verbalized every question LC ask and did not give mom opportunity to answer for herself.   Maternal Data    Feeding    LATCH Score                   Interventions    Lactation Tools Discussed/Used     Consult Status Consult Status: Follow-up Date: 12/02/17 Follow-up type: In-patient    Vicente Serene 12/01/2017, 12:42 AM

## 2017-12-02 ENCOUNTER — Encounter (HOSPITAL_COMMUNITY): Payer: Self-pay | Admitting: Lactation Services

## 2017-12-02 MED ORDER — OXYCODONE HCL 5 MG PO TABS: 5.0000 mg | ORAL_TABLET | ORAL | 0 refills | Status: AC | PRN

## 2017-12-02 MED ORDER — IBUPROFEN 800 MG PO TABS: 800.0000 mg | ORAL_TABLET | Freq: Three times a day (TID) | ORAL | 0 refills | Status: DC | PRN

## 2017-12-02 NOTE — Discharge Instructions (Signed)
Nothing in vagina for 6 weeks.  No sex, tampons, and douching.  Other instructions as in [facility] Discharge Booklet. °

## 2017-12-02 NOTE — Progress Notes (Signed)
3 Days Post-Op Procedure(s) (LRB): REPEAT CESAREAN SECTION (N/A)  Subjective: Patient reports tolerating PO, + flatus, + BM and no problems voiding.  She is ambulating well. Tolerating PO. No CP, SOB or dizziness. Bonding well with baby - breastfeeding  Objective: I have reviewed patient's vital signs, intake and output and medications.  General: alert, cooperative and no distress GI: soft, non-tender; bowel sounds normal; no masses,  no organomegaly and incision: clean, dry and intact Extremities: edema mild  Assessment: s/p Procedure(s) with comments: REPEAT CESAREAN SECTION (N/A) - Heather,  RNFA: stable  Plan: Discharge home  LOS: 3 days    [place] 12/02/2017, 12:46 PM

## 2017-12-02 NOTE — Discharge Summary (Signed)
OB Discharge Summary     Patient Name: Kelly Harvey DOB: [birth year] MRN: [id number]  Date of admission: 11/29/2017 Delivering MD: Janyth Contes   Date of discharge: 12/02/2017  Admitting diagnosis: repeat c-section Intrauterine pregnancy: [redacted]w[redacted]d     Secondary diagnosis:  Principal Problem:   H/O cesarean section complicating pregnancy Active Problems:   Status post repeat low transverse cesarean section  Additional problems: none     Discharge diagnosis: Term Pregnancy Delivered                                                                                                Post partum procedures:none  Augmentation: n/a  Complications: None  Hospital course:  Sceduled C/S   37 y.o. yo G2P1001 at [redacted]w[redacted]d was admitted to the hospital 11/29/2017 for scheduled cesarean section with the following indication:Elective Repeat.  Membrane Rupture Time/Date: 9:49 AM ,11/29/2017   Patient delivered a Viable infant.11/29/2017  Details of operation can be found in separate operative note.  Pateint had an uncomplicated postpartum course.  She is ambulating, tolerating a regular diet, passing flatus, and urinating well. Patient is discharged home in stable condition on  12/02/17         Physical exam  Vitals:   12/01/17 0559 12/01/17 1442 12/01/17 2141 12/02/17 0526  BP: 117/62 117/62 109/71 108/68  Pulse: (!) 52 67 77 (!) 53  Resp: 18 16 18 18   Temp: (!) 97.5 F (36.4 C) 97.9 F (36.6 C) (!) 97.4 F (36.3 C) 98 F (36.7 C)  TempSrc: Oral Oral Oral Oral  SpO2: 100% 100%  100%  Weight:      Height:       General: alert, cooperative and no distress Lochia: appropriate Uterine Fundus: firm Incision: Dressing is clean, dry, and intact DVT Evaluation: No evidence of DVT seen on physical exam. Labs: Lab Results  Component Value Date   WBC 11.9 (H) 11/30/2017   HGB 9.0 (L) 11/30/2017   HCT 25.8 (L) 11/30/2017   MCV 92.1 11/30/2017   PLT 202 11/30/2017   No flowsheet data  found.  Discharge instruction: per After Visit Summary and "Baby and Me Booklet".  After visit meds:  Allergies as of 12/02/2017   No Known Allergies     Medication List    TAKE these medications   folic acid 485 MCG tablet Commonly known as:  FOLVITE Take 800 mcg by mouth daily.   ibuprofen 800 MG tablet Commonly known as:  ADVIL,MOTRIN Take 1 tablet (800 mg total) by mouth every 8 (eight) hours as needed for moderate pain.   multivitamin-prenatal 27-0.8 MG Tabs tablet Take 1 tablet by mouth daily at 12 noon.   oxyCODONE 5 MG immediate release tablet Commonly known as:  Oxy IR/ROXICODONE Take 1 tablet (5 mg total) by mouth every 4 (four) hours as needed for up to 7 days for severe pain (pain scale 4-7).   polyethylene glycol powder powder Commonly known as:  GLYCOLAX/MIRALAX Take 17 g by mouth daily. As needed for constipation            Discharge Care Instructions  (  From admission, onward)         Start     Ordered   12/02/17 0000  Discharge wound care:    Comments:  May shower, pat incision dry   12/02/17 1252          Diet: routine diet  Activity: Advance as tolerated. Pelvic rest for 6 weeks.   Outpatient follow up:2 weeks Follow up Appt:No future appointments. Follow up Visit:No follow-ups on file.  Postpartum contraception: Not Discussed  Newborn Data: Live born female  Birth Weight: 5 lb 10.7 oz (2570 g) APGAR: 8, 9  Newborn Delivery   Birth date/time:  11/29/2017 09:50:00 Delivery type:  C-Section, Vacuum Assisted Trial of labor:  No C-section categorization:  Repeat     Baby Feeding: Breast Disposition:home with mother   12/02/2017 Isaiah Serge, DO

## 2017-12-02 NOTE — Lactation Note (Signed)
This note was copied from a baby's chart. Lactation Consultation Note  Patient Name: Kelly Harvey Date: 12/02/2017 Reason for consult: Follow-up assessment;Infant < 6lbs Breasts are full this morning but not engorged.  Mom reports baby is feeding well and she feels softening with the feeding.  Instructed to continue to feed with cues.  Manual pump given for prn use.  Lactation outpatient services and support reviewed and encouraged prn.  Maternal Data    Feeding    LATCH Score                   Interventions    Lactation Tools Discussed/Used     Consult Status Consult Status: Complete Follow-up type: Call as needed    Ave Filter 12/02/2017, 9:16 AM

## 2018-01-09 IMAGING — US US MFM FETAL BPP W/O NON-STRESS
1 series · 8 of 8 positions shown · non-contrast
Comparison: none

[Series 1: us mfm fetal bpp w/o non-stress · 8 acquisitions, 8 frames shown]
[im 1/8]
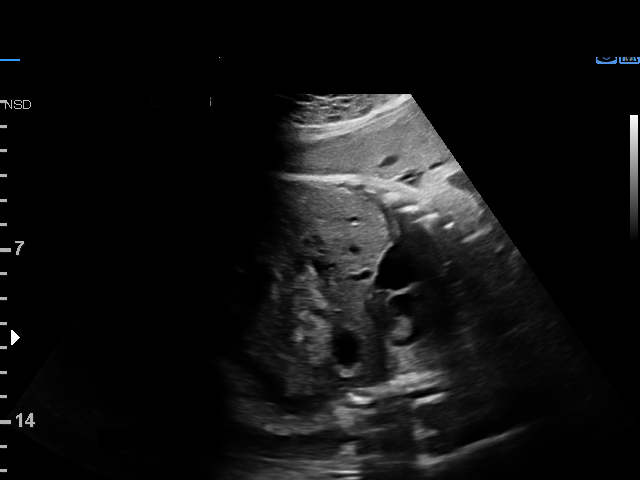
[im 2/8]
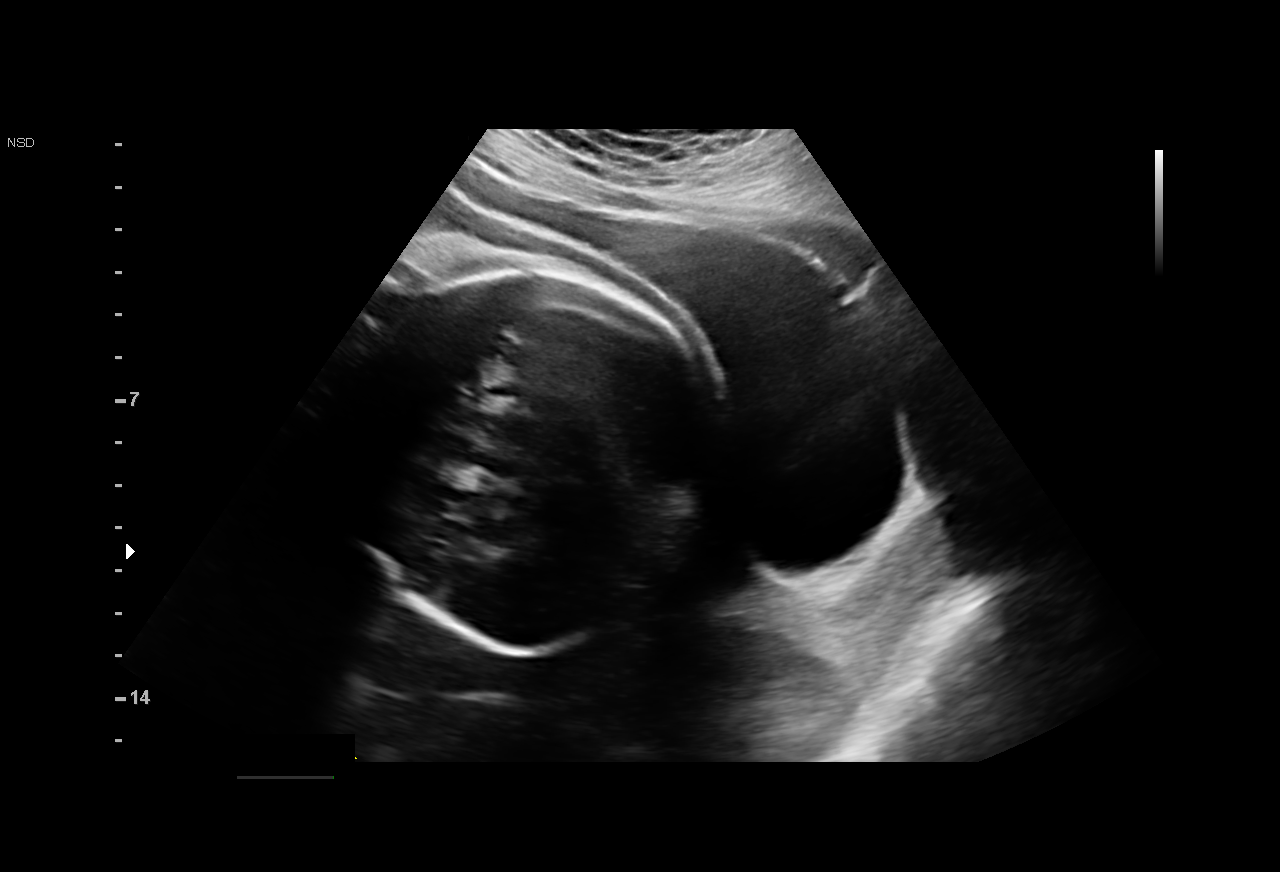
[im 3/8]
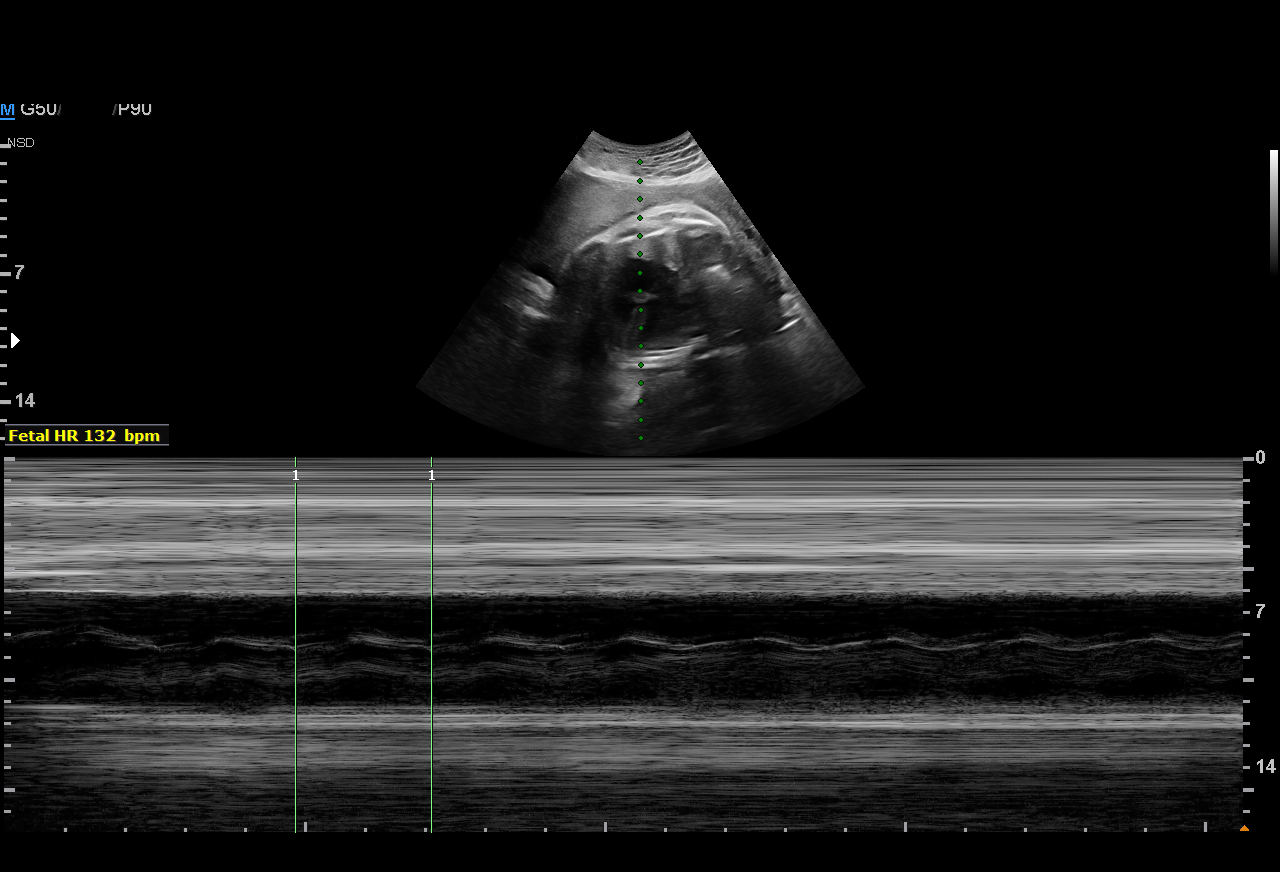
[im 4/8]
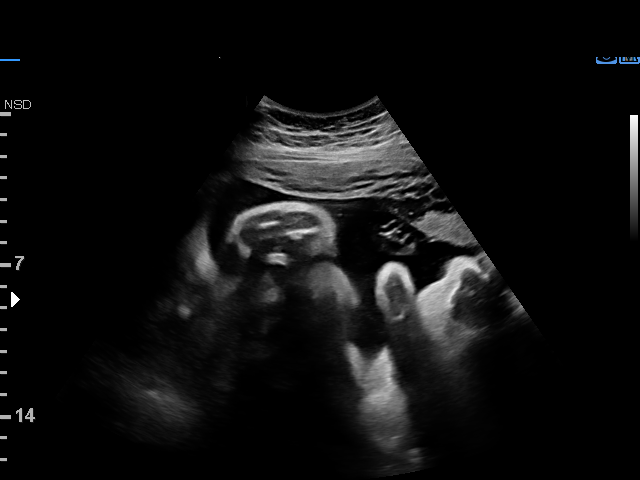
[im 5/8]
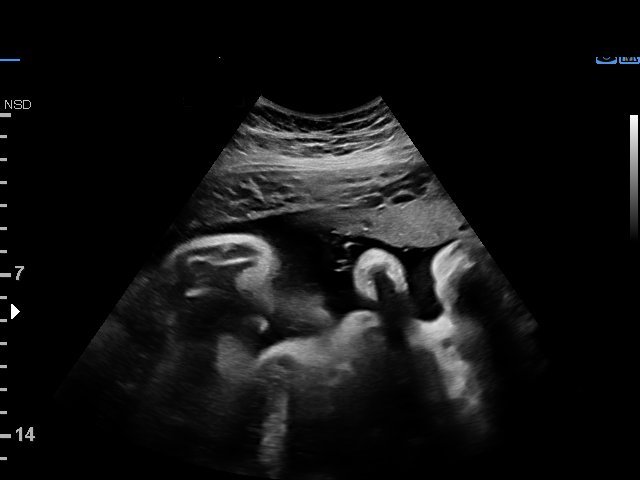
[im 6/8]
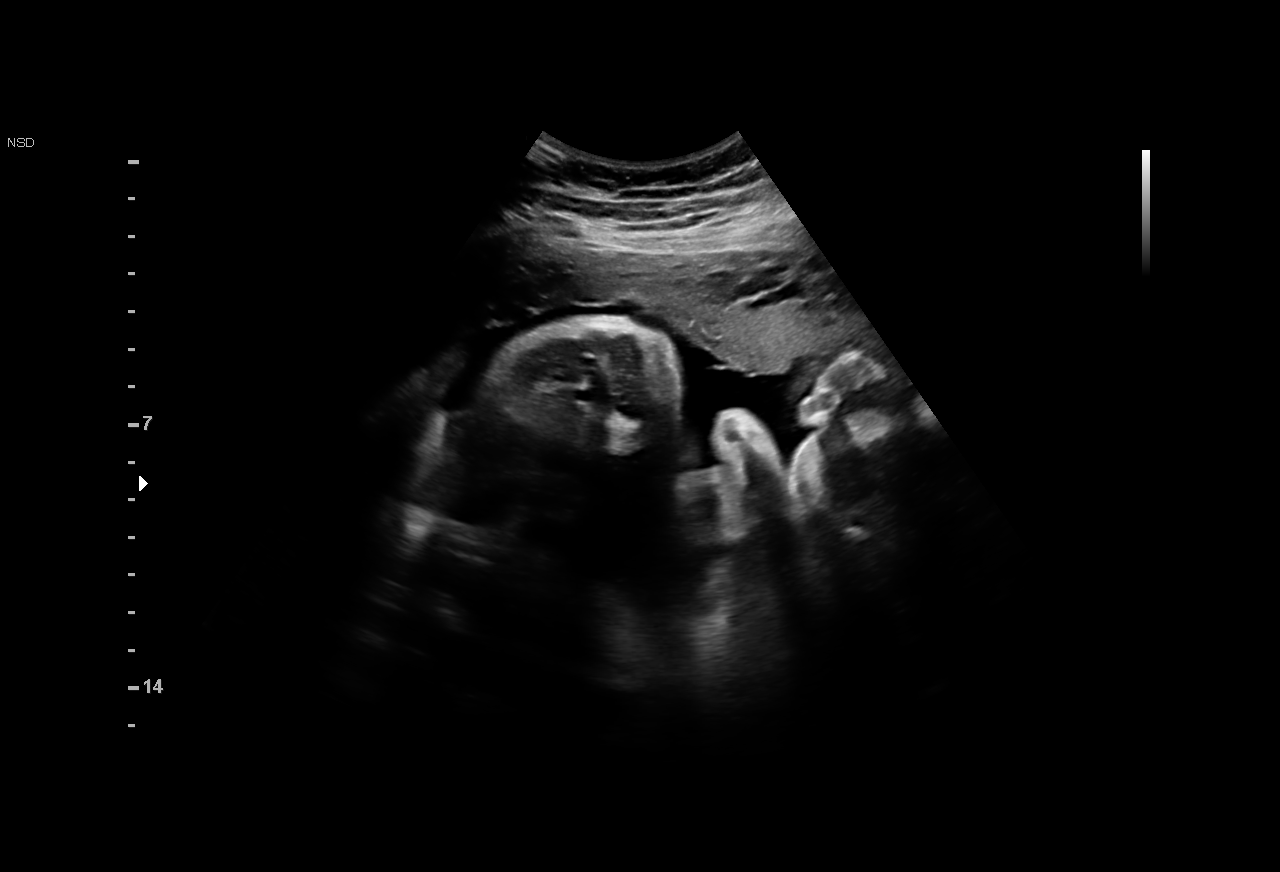
[im 7/8]
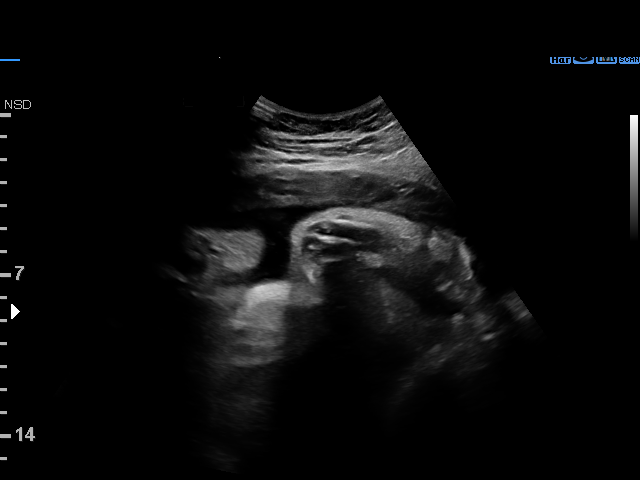
[im 8/8]
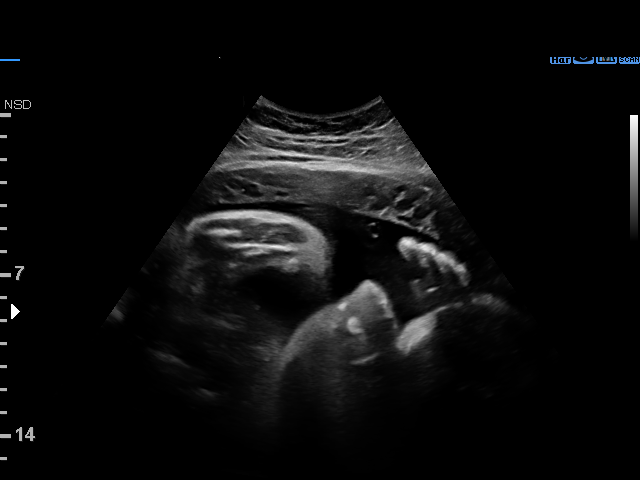

[8 of 8 positions shown; findings below may reference images not displayed]

MAU/Triage

Indications

37 weeks gestation of pregnancy
Decreased fetal movements, third trimester,
unspecified
Non-reactive NST, FHR decelerations
Advanced maternal age primigravida 35+,
third trimester
Pregnancy resulting from assisted
reproductive technology (IVF)
OB History

Gravidity:    1         Term:   0        Prem:   0        SAB:   0
TOP:          0       Ectopic:  0        Living: 0
Fetal Evaluation

Num Of Fetuses:     1
Fetal Heart         132
Rate(bpm):
Cardiac Activity:   Observed
Presentation:       Cephalic

Amniotic Fluid
AFI FV:      Subjectively within normal limits

AFI Sum(cm)     %Tile       Largest Pocket(cm)
14.45           55

RUQ(cm)       RLQ(cm)       LUQ(cm)        LLQ(cm)
5.07
Biophysical Evaluation
Amniotic F.V:   Pocket => 2 cm two         F. Tone:        Observed
planes
F. Movement:    Observed                   Score:          [DATE]
F. Breathing:   Observed
Gestational Age

Clinical EDD:  37w 5d                                        EDD:   08/04/16
Best:          37w 5d    Det. By:   Clinical EDD             EDD:   08/04/16
Impression

IUP at 37+5 weeks with nonreactive NST
Normal amniotic fluid volume
BPP [DATE]
Recommendations

Follow-up ultrasounds as clinically indicated.

## 2018-07-04 ENCOUNTER — Other Ambulatory Visit: Payer: Self-pay

## 2018-07-04 ENCOUNTER — Encounter: Payer: Self-pay | Admitting: Family Medicine

## 2018-07-04 ENCOUNTER — Ambulatory Visit (INDEPENDENT_AMBULATORY_CARE_PROVIDER_SITE_OTHER): Payer: BLUE CROSS/BLUE SHIELD | Admitting: Family Medicine

## 2018-07-04 VITALS — BP 118/68 | HR 78 | Temp 98.6°F | Resp 17 | Ht 65.0 in | Wt 156.2 lb

## 2018-07-04 DIAGNOSIS — Z0000 Encounter for general adult medical examination without abnormal findings: Secondary | ICD-10-CM

## 2018-07-04 DIAGNOSIS — E663 Overweight: Secondary | ICD-10-CM

## 2018-07-04 DIAGNOSIS — R5383 Other fatigue: Secondary | ICD-10-CM | POA: Diagnosis not present

## 2018-07-04 LAB — BASIC METABOLIC PANEL
BUN: 16 mg/dL (ref 6–23)
CO2: 29 mEq/L (ref 19–32)
Calcium: 9.6 mg/dL (ref 8.4–10.5)
Chloride: 100 mEq/L (ref 96–112)
Creatinine, Ser: 0.68 mg/dL (ref 0.40–1.20)
GFR: 97.26 mL/min (ref >60.00)
Glucose, Bld: 77 mg/dL (ref 70–99)
Potassium: 4.1 mEq/L (ref 3.5–5.1)
Sodium: 138 mEq/L (ref 135–145)

## 2018-07-04 LAB — LIPID PANEL
Cholesterol: 167 mg/dL (ref 0–200)
HDL: 62 mg/dL (ref >39.00)
LDL Cholesterol: 87 mg/dL (ref 0–99)
NonHDL: 105
Total CHOL/HDL Ratio: 3
Triglycerides: 88 mg/dL (ref 0.0–149.0)
VLDL: 17.6 mg/dL (ref 0.0–40.0)

## 2018-07-04 LAB — B12 AND FOLATE PANEL
Folate: >23.9 ng/mL (ref >5.9)
Vitamin B-12: 464 pg/mL (ref 211–911)

## 2018-07-04 LAB — CBC WITH DIFFERENTIAL/PLATELET
Basophils Absolute: 0 10*3/uL (ref 0.0–0.1)
Basophils Relative: 0.4 % (ref 0.0–3.0)
Eosinophils Absolute: 0.1 10*3/uL (ref 0.0–0.7)
Eosinophils Relative: 0.8 % (ref 0.0–5.0)
HCT: 35.9 % — ABNORMAL LOW (ref 36.0–46.0)
Hemoglobin: 12.3 g/dL (ref 12.0–15.0)
Lymphocytes Relative: 31.7 % (ref 12.0–46.0)
Lymphs Abs: 2.3 10*3/uL (ref 0.7–4.0)
MCHC: 34.2 g/dL (ref 30.0–36.0)
MCV: 90.4 fl (ref 78.0–100.0)
Monocytes Absolute: 0.3 10*3/uL (ref 0.1–1.0)
Monocytes Relative: 4.3 % (ref 3.0–12.0)
Neutro Abs: 4.6 10*3/uL (ref 1.4–7.7)
Neutrophils Relative %: 62.8 % (ref 43.0–77.0)
Platelets: 379 10*3/uL (ref 150.0–400.0)
RBC: 3.97 Mil/uL (ref 3.87–5.11)
RDW: 13.3 % (ref 11.5–15.5)
WBC: 7.4 10*3/uL (ref 4.0–10.5)

## 2018-07-04 LAB — HEPATIC FUNCTION PANEL
ALT: 14 U/L (ref 0–35)
AST: 18 U/L (ref 0–37)
Albumin: 4.2 g/dL (ref 3.5–5.2)
Alkaline Phosphatase: 94 U/L (ref 39–117)
Bilirubin, Direct: 0.1 mg/dL (ref 0.0–0.3)
Total Bilirubin: 0.4 mg/dL (ref 0.2–1.2)
Total Protein: 7.2 g/dL (ref 6.0–8.3)

## 2018-07-04 LAB — VITAMIN D 25 HYDROXY (VIT D DEFICIENCY, FRACTURES): VITD: 38.44 ng/mL (ref 30.00–100.00)

## 2018-07-04 LAB — TSH: TSH: 2.55 u[IU]/mL (ref 0.35–4.50)

## 2018-07-04 MED ORDER — METRONIDAZOLE 0.75 % EX GEL: 1.0000 "application " | Freq: Two times a day (BID) | CUTANEOUS | 1 refills | Status: DC

## 2018-07-04 MED ORDER — HYDROCORTISONE ACETATE 25 MG RE SUPP: 25.0000 mg | Freq: Two times a day (BID) | RECTAL | 0 refills | Status: DC | PRN

## 2018-07-04 MED ORDER — POLYETHYLENE GLYCOL 3350 17 GM/SCOOP PO POWD: 17.0000 g | Freq: Every day | ORAL | 1 refills | Status: DC

## 2018-07-04 NOTE — Patient Instructions (Addendum)
Follow up in 1 year or as needed We'll notify you of your lab results and make any changes if needed Continue to work on healthy diet and regular exercise- you can do it! Use the cream for the hemorrhoids Use the Metronidazole as directed for the rosacea Take the Miralax as needed for constipation Call with any questions or concerns Stay Safe!!

## 2018-07-04 NOTE — Assessment & Plan Note (Signed)
Pt's PE WNL w/ exception of being overweight.  UTD on GYN.  Check labs.  Anticipatory guidance provided.

## 2018-07-04 NOTE — Progress Notes (Signed)
   Subjective:    Patient ID: [facility], female    DOB: September 11, 1980, 38 y.o.   MRN: [id number]  HPI CPE- UTD on GYN, immunizations.  + menorrhagia- following w/ Dr Melba Coon   Review of Systems Patient reports no vision/ hearing changes, adenopathy,fever, weight change,  persistant/recurrent hoarseness , swallowing issues, chest pain, palpitations, edema, persistant/recurrent cough, hemoptysis, dyspnea (rest/exertional/paroxysmal nocturnal), gastrointestinal bleeding (melena, rectal bleeding), abdominal pain, significant heartburn, bowel changes, GU symptoms (dysuria, hematuria, incontinence),  syncope, focal weakness, memory loss, numbness & tingling, hair/nail changes, abnormal bruising or bleeding, anxiety, or depression.   + hemorrhoids/constipation + rosacea- asking for refill on Metronidazole 0.75% + fatigue, likely from menorrhagia    Objective:   Physical Exam General Appearance:    Alert, cooperative, no distress, appears stated age  Head:    Normocephalic, without obvious abnormality, atraumatic  Eyes:    PERRL, conjunctiva/corneas clear, EOM's intact, fundi    benign, both eyes  Ears:    Normal TM's and external ear canals, both ears  Nose:   Nares normal, septum midline, mucosa normal, no drainage    or sinus tenderness  Throat:   Deferred due to COVID  Neck:   Supple, symmetrical, trachea midline, no adenopathy;    Thyroid: no enlargement/tenderness/nodules  Back:     Symmetric, no curvature, ROM normal, no CVA tenderness  Lungs:     Clear to auscultation bilaterally, respirations unlabored  Chest Wall:    No tenderness or deformity   Heart:    Regular rate and rhythm, S1 and S2 normal, no murmur, rub   or gallop  Breast Exam:    Deferred to GYN  Abdomen:     Soft, non-tender, bowel sounds active all four quadrants,    no masses, no organomegaly  Genitalia:    Deferred to GYN  Rectal:    Extremities:   Extremities normal, atraumatic, no cyanosis or edema  Pulses:   2+  and symmetric all extremities  Skin:   Skin color, texture, turgor normal, no rashes or lesions  Lymph nodes:   Cervical, supraclavicular, and axillary nodes normal  Neurologic:   CNII-XII intact, normal strength, sensation and reflexes    throughout          Assessment & Plan:

## 2018-07-04 NOTE — Assessment & Plan Note (Signed)
Ongoing issue for pt.  Applauded her efforts at healthy diet and regular exercise.  Check labs to risk stratify.  Will follow.

## 2018-07-05 ENCOUNTER — Encounter: Payer: Self-pay | Admitting: General Practice

## 2018-07-05 LAB — IRON,TIBC AND FERRITIN PANEL
%SAT: 15 % (calc) — ABNORMAL LOW (ref 16–45)
Ferritin: 22 ng/mL (ref 16–154)
Iron: 48 ug/dL (ref 40–190)
TIBC: 325 mcg/dL (calc) (ref 250–450)

## 2018-10-24 ENCOUNTER — Other Ambulatory Visit: Payer: Self-pay

## 2018-10-24 ENCOUNTER — Ambulatory Visit: Payer: BLUE CROSS/BLUE SHIELD

## 2019-11-07 ENCOUNTER — Emergency Department (HOSPITAL_COMMUNITY): Payer: 59

## 2019-11-07 ENCOUNTER — Encounter (HOSPITAL_COMMUNITY): Payer: Self-pay

## 2019-11-07 ENCOUNTER — Encounter (HOSPITAL_COMMUNITY): Payer: Self-pay | Admitting: *Deleted

## 2019-11-07 ENCOUNTER — Emergency Department (HOSPITAL_COMMUNITY)
Admission: EM | Admit: 2019-11-07 | Discharge: 2019-11-08 | Disposition: A | Discharge status: Home / Self Care | Payer: 59 | Attending: Emergency Medicine | Admitting: Emergency Medicine

## 2019-11-07 ENCOUNTER — Other Ambulatory Visit: Payer: Self-pay

## 2019-11-07 ENCOUNTER — Ambulatory Visit (HOSPITAL_COMMUNITY)
Admission: EM | Admit: 2019-11-07 | Discharge: 2019-11-07 | Disposition: A | Discharge status: Nursing Home (Includes ICF) | Payer: 59

## 2019-11-07 DIAGNOSIS — R202 Paresthesia of skin: Secondary | ICD-10-CM | POA: Insufficient documentation

## 2019-11-07 DIAGNOSIS — Z87891 Personal history of nicotine dependence: Secondary | ICD-10-CM | POA: Diagnosis not present

## 2019-11-07 DIAGNOSIS — R200 Anesthesia of skin: Secondary | ICD-10-CM

## 2019-11-07 DIAGNOSIS — R5383 Other fatigue: Secondary | ICD-10-CM | POA: Diagnosis not present

## 2019-11-07 LAB — DIFFERENTIAL
Abs Immature Granulocytes: 0.01 10*3/uL (ref 0.00–0.07)
Basophils Absolute: 0 10*3/uL (ref 0.0–0.1)
Basophils Relative: 1 %
Eosinophils Absolute: 0.1 10*3/uL (ref 0.0–0.5)
Eosinophils Relative: 1 %
Immature Granulocytes: 0 %
Lymphocytes Relative: 33 %
Lymphs Abs: 2.1 10*3/uL (ref 0.7–4.0)
Monocytes Absolute: 0.3 10*3/uL (ref 0.1–1.0)
Monocytes Relative: 4 %
Neutro Abs: 3.9 10*3/uL (ref 1.7–7.7)
Neutrophils Relative %: 61 %

## 2019-11-07 LAB — COMPREHENSIVE METABOLIC PANEL
ALT: 19 U/L (ref 0–44)
AST: 37 U/L (ref 15–41)
Albumin: 3.9 g/dL (ref 3.5–5.0)
Alkaline Phosphatase: 59 U/L (ref 38–126)
Anion gap: 9 (ref 5–15)
BUN: 15 mg/dL (ref 6–20)
CO2: 24 mmol/L (ref 22–32)
Calcium: 9.2 mg/dL (ref 8.9–10.3)
Chloride: 103 mmol/L (ref 98–111)
Creatinine, Ser: 0.73 mg/dL (ref 0.44–1.00)
GFR, Estimated: >60 mL/min (ref >60)
Glucose, Bld: 85 mg/dL (ref 70–99)
Potassium: 5 mmol/L (ref 3.5–5.1)
Sodium: 136 mmol/L (ref 135–145)
Total Bilirubin: 1.3 mg/dL — ABNORMAL HIGH (ref 0.3–1.2)
Total Protein: 7.2 g/dL (ref 6.5–8.1)

## 2019-11-07 LAB — I-STAT CHEM 8, ED
BUN: 20 mg/dL (ref 6–20)
Calcium, Ion: 1.14 mmol/L — ABNORMAL LOW (ref 1.15–1.40)
Chloride: 102 mmol/L (ref 98–111)
Creatinine, Ser: 0.7 mg/dL (ref 0.44–1.00)
Glucose, Bld: 86 mg/dL (ref 70–99)
HCT: 40 % (ref 36.0–46.0)
Hemoglobin: 13.6 g/dL (ref 12.0–15.0)
Potassium: 4.5 mmol/L (ref 3.5–5.1)
Sodium: 139 mmol/L (ref 135–145)
TCO2: 29 mmol/L (ref 22–32)

## 2019-11-07 LAB — PROTIME-INR
INR: 0.9 (ref 0.8–1.2)
Prothrombin Time: 12 seconds (ref 11.4–15.2)

## 2019-11-07 LAB — CBC
HCT: 40.1 % (ref 36.0–46.0)
Hemoglobin: 12.9 g/dL (ref 12.0–15.0)
MCH: 27.9 pg (ref 26.0–34.0)
MCHC: 32.2 g/dL (ref 30.0–36.0)
MCV: 86.6 fL (ref 80.0–100.0)
Platelets: 324 10*3/uL (ref 150–400)
RBC: 4.63 MIL/uL (ref 3.87–5.11)
RDW: 15.2 % (ref 11.5–15.5)
WBC: 6.4 10*3/uL (ref 4.0–10.5)
nRBC: 0 % (ref 0.0–0.2)

## 2019-11-07 LAB — I-STAT BETA HCG BLOOD, ED (MC, WL, AP ONLY): I-stat hCG, quantitative: <5 m[IU]/mL (ref <5)

## 2019-11-07 LAB — APTT: aPTT: 35 seconds (ref 24–36)

## 2019-11-07 MED ORDER — SODIUM CHLORIDE 0.9% FLUSH
3.0000 mL | Freq: Once | INTRAVENOUS | Status: DC
Start: 2019-11-07 — End: 2019-11-08

## 2019-11-07 NOTE — ED Triage Notes (Signed)
Pt arrives to ED from UC w/ c/o intermittent numbness to L arm, back of head, and lips x 6 days. Pt also endorses increased fatigue, headache during this time. Pt denies chest pain, sob. Pt AOx4 and otherwise neuro intact.

## 2019-11-07 NOTE — ED Notes (Signed)
Discussed sxs with Dr. Mannie Stabile.  Per Dr. Mannie Stabile, pt ultimately will need to be seen in ED for CT scan based on sxs.  Pt and pt advocate both verbalize understanding.  Patient is being discharged from the Urgent Care and sent to the Emergency Department via private vehicle with pt advocate. Per Dr Mannie Stabile, patient is in need of higher level of care due to progressing parasthesias with onset HA. Patient is aware and verbalizes understanding of plan of care.  Vitals:   11/07/19 1321  BP: 132/84  Pulse: 66  Resp: 14  Temp: 98.2 F (36.8 C)  SpO2: 100%

## 2019-11-07 NOTE — ED Triage Notes (Signed)
C/O constant left arm and leg numbness 6 days ago; initially started in left shoulder, arm, hand, then began in left leg and moving down into left calf. C/O HA onset this AM.  Denies n/v.  Denies any changes in eyesight.  C/O intermittent dizziness. Face symmetrical, tongue appears midline.  Bilat hand grasp and foot push = and strong.

## 2019-11-08 NOTE — ED Provider Notes (Signed)
[facility] Provider Note   CSN: [id number] Arrival date & time: 11/07/19  1332     History Chief Complaint  Patient presents with  . Stroke Symptoms    Kelly [facility] is a 39 y.o. female.  Patient presents to the emergency department with chief complaint of 6 days of intermittent numbness and tingling in lips, left arm, and left leg.  She denies having had any weakness.  She reports that she feels fatigued.  She denies any vision changes or speech changes.  She denies chest pain or shortness of breath.  She denies having any symptoms now.  Denies any history or family history of MS.  The history is provided by the patient. No language interpreter was used.       Past Medical History:  Diagnosis Date  . Allergy   . H/O cesarean section complicating pregnancy 61/04/4313  . History of chicken pox   . Newborn product of in vitro fertilization (IVF) pregnancy     Patient Active Problem List   Diagnosis Date Noted  . Physical exam 07/04/2018  . Status post repeat low transverse cesarean section 11/29/2017  . H/O cesarean section complicating pregnancy 40/08/6759  . Irregular menses 01/27/2017  . Overweight (BMI 25.0-29.9) 01/27/2017  . Constipation 01/27/2017  . Low back pain 01/27/2017  . S/P cesarean section 07/20/2016  . Sebaceous cyst of skin of left breast 03/13/2013  . Allergic rhinitis 06/03/2012    Past Surgical History:  Procedure Laterality Date  . CESAREAN SECTION N/A 07/20/2016   Procedure: CESAREAN SECTION;  Surgeon: Janyth Contes, MD;  Location: [facility];  Service: Obstetrics;  Laterality: N/A;  . CESAREAN SECTION N/A 11/29/2017   Procedure: REPEAT CESAREAN SECTION;  Surgeon: Janyth Contes, MD;  Location: [facility];  Service: Obstetrics;  Laterality: N/A;  Heather,  RNFA     OB History    Gravida  2   Para  1   Term  1   Preterm      AB      Living  1     SAB      TAB       Ectopic      Multiple  0   Live Births  1           Family History  Problem Relation Age of Onset  . Diabetes Mother   . Hyperlipidemia Mother   . Hypertension Mother   . Miscarriages / [place] Mother   . Hyperlipidemia Father   . Hypertension Father   . Healthy Brother   . Healthy Son   . Cancer Paternal Aunt        brain  . Healthy Brother   . Healthy Brother   . Healthy Brother     Social History   Tobacco Use  . Smoking status: Never Smoker  . Smokeless tobacco: Former Network engineer  . Vaping Use: Never used  Substance Use Topics  . Alcohol use: No  . Drug use: No    Home Medications Prior to Admission medications   Medication Sig Start Date End Date Taking? Authorizing Provider  hydrocortisone (ANUSOL-HC) 25 MG suppository Place 1 suppository (25 mg total) rectally 2 (two) times daily as needed for hemorrhoids or anal itching. 07/04/18   [name], MD  ibuprofen (ADVIL,MOTRIN) 800 MG tablet Take 1 tablet (800 mg total) by mouth every 8 (eight) hours as needed for moderate pain. 12/02/17   Carlynn Purl  Worema, DO  metroNIDAZOLE (METROGEL) 0.75 % gel Apply 1 application topically 2 (two) times daily. 07/04/18   [name], MD  polyethylene glycol powder (GLYCOLAX/MIRALAX) 17 GM/SCOOP powder Take 17 g by mouth daily. 07/04/18   [name], MD    Allergies    Patient has no known allergies.  Review of Systems   Review of Systems  All other systems reviewed and are negative.   Physical Exam Updated Vital Signs BP (!) 142/89   Pulse 63   Temp 97.6 F (36.4 C)   Resp (!) 22   SpO2 100%   Physical Exam Vitals and nursing note reviewed.  Constitutional:      General: She is not in acute distress.    Appearance: She is well-developed.  HENT:     Head: Normocephalic and atraumatic.  Eyes:     Conjunctiva/sclera: Conjunctivae normal.  Cardiovascular:     Rate and Rhythm: Normal rate and regular rhythm.      Heart sounds: No murmur heard.   Pulmonary:     Effort: Pulmonary effort is normal. No respiratory distress.     Breath sounds: Normal breath sounds.  Abdominal:     Palpations: Abdomen is soft.     Tenderness: There is no abdominal tenderness.  Musculoskeletal:        General: Normal range of motion.     Cervical back: Neck supple.  Skin:    General: Skin is warm and dry.  Neurological:     Mental Status: She is alert and oriented to person, place, and time.     Comments: CN 3-12 intact Speech is clear No pronator drift Normal finger to nose   Psychiatric:        Mood and Affect: Mood normal.        Behavior: Behavior normal.     Comments: anxious     ED Results / Procedures / Treatments   Labs (all labs ordered are listed, but only abnormal results are displayed) Labs Reviewed  COMPREHENSIVE METABOLIC PANEL - Abnormal; Notable for the following components:      Result Value   Total Bilirubin 1.3 (*)    All other components within normal limits  I-STAT CHEM 8, ED - Abnormal; Notable for the following components:   Calcium, Ion 1.14 (*)    All other components within normal limits  PROTIME-INR  APTT  CBC  DIFFERENTIAL  CBG MONITORING, ED  I-STAT BETA HCG BLOOD, ED ([facility], [facility], [facility] ONLY)    EKG EKG Interpretation  Date/Time:  Tuesday November 07 2019 13:53:30 EDT Ventricular Rate:  62 PR Interval:  140 QRS Duration: 80 QT Interval:  412 QTC Calculation: 418 R Axis:   4 Text Interpretation: Normal sinus rhythm Confirmed by Randal Buba, April ([id number]) on 11/08/2019 12:23:55 AM   Radiology CT HEAD WO CONTRAST  Result Date: 11/07/2019 CLINICAL DATA:  Neuro deficit, acute, stroke suspected. Additional history provided: Intermittent numbness to left arm, back of head, lips, symptoms for 6 days. EXAM: CT HEAD WITHOUT CONTRAST TECHNIQUE: Contiguous axial images were obtained from the base of the skull through the vertex without intravenous contrast. COMPARISON:  No  pertinent prior exams are available for comparison. FINDINGS: Brain: Cerebral volume is normal. There is no acute intracranial hemorrhage. No demarcated cortical infarct. No extra-axial fluid collection. No evidence of intracranial mass. No midline shift. Vascular: No hyperdense vessel. Skull: Normal. Negative for fracture or focal lesion. Sinuses/Orbits: Visualized orbits show no acute finding. No significant paranasal sinus  disease or mastoid effusion at the imaged levels. IMPRESSION: Unremarkable non-contrast CT appearance of the brain. No evidence of acute intracranial abnormality. Electronically Signed   By: Kellie Simmering DO   On: 11/07/2019 15:09    Procedures Procedures (including critical care time)  Medications Ordered in ED Medications  sodium chloride flush (NS) 0.9 % injection 3 mL (has no administration in time range)    ED Course  I have reviewed the triage vital signs and the nursing notes.  Pertinent labs & imaging results that were available during my care of the patient were reviewed by me and considered in my medical decision making (see chart for details).    MDM Rules/Calculators/A&P                          This patient complains of intermittent numbness/tingling in left arm and leg for the past 6 days along with intermittent lip tingling, this involves an extensive number of treatment options, and is a complaint that carries with it a high risk of complications and morbidity.    Differential Dx Stroke, TIA, MS, anxiety  Pertinent Labs I reviewed, and interpreted labs, which included CBC, CMP, HCG, INR, all of which are normal.  Imaging Interpretation I reviewed imaging studies which included CT head, which showed no evidence of stroke, mass, or bleed.   Reassessments After the interventions stated above, I reevaluated the patient and found still symptom free, but anxious.  I did offer MRI, but patient ultimately chooses to follow-up on outpatient basis.    NIH  stroke scale 0.  Plan I discussed the patient's symptoms and workup plan with Dr. Randal Buba, who agrees with plan for outpatient neurology follow-up.  Negative head CT and normal labs.       Final Clinical Impression(s) / ED Diagnoses Final diagnoses:  Numbness and tingling    Rx / DC Orders ED Discharge Orders    None       Montine Circle, PA-C 11/08/19 [place], April, MD 11/08/19 [phone]

## 2019-11-09 ENCOUNTER — Encounter: Payer: Self-pay | Admitting: Neurology

## 2019-11-09 ENCOUNTER — Telehealth: Payer: Self-pay | Admitting: Neurology

## 2019-11-09 ENCOUNTER — Ambulatory Visit (INDEPENDENT_AMBULATORY_CARE_PROVIDER_SITE_OTHER): Payer: 59 | Admitting: Neurology

## 2019-11-09 VITALS — BP 139/91 | HR 67 | Ht 65.0 in | Wt 147.0 lb

## 2019-11-09 DIAGNOSIS — R52 Pain, unspecified: Secondary | ICD-10-CM | POA: Diagnosis not present

## 2019-11-09 DIAGNOSIS — R202 Paresthesia of skin: Secondary | ICD-10-CM

## 2019-11-09 DIAGNOSIS — R200 Anesthesia of skin: Secondary | ICD-10-CM | POA: Diagnosis not present

## 2019-11-09 DIAGNOSIS — F439 Reaction to severe stress, unspecified: Secondary | ICD-10-CM

## 2019-11-09 DIAGNOSIS — F419 Anxiety disorder, unspecified: Secondary | ICD-10-CM

## 2019-11-09 NOTE — Telephone Encounter (Signed)
[facility] pending

## 2019-11-09 NOTE — Patient Instructions (Addendum)
Your neurological exam is normal and this is very reassuring.  You had a recent head CT without any obvious problems seen.  Nevertheless, I would like to proceed with a brain MRI with and without contrast to rule out a structural cause of your intermittent left-sided numbness and tingling and pain. We will have to schedule your MRI on a separate date. This test requires authorization from your insurance, and we will take care of the insurance process.  I would like to do some blood work today.  Please keep your appointment with your primary care physician as well as she may need to do routine blood work which is checked during a physical.   Please also mention your vitamin D deficiency and mention your anxiety and stress.  She may have further suggestions or even medications for you.  We will call you with your MRI results and blood test results by phone call and make an appointment in this clinic if needed.    I also would like for you to get a formal eye examination done as you have prescription eyeglasses and have not had a eye examination in about 4 years.

## 2019-11-09 NOTE — Telephone Encounter (Signed)
[facility] auth: [id number] (exp. 11/09/19 to 02/07/20) order sent to GI. They will reach out to the patient to schedule.

## 2019-11-09 NOTE — Progress Notes (Signed)
Subjective:    Patient ID: Kelly Harvey is a 39 y.o. female.  HPI     Star Age, MD, PhD [facility] [address], [address] [place], [place] [address]  I saw patient, Kelly Harvey, as a Referral from the emergency room for intermittent numbness and tingling.  The patient is accompanied by her sister today.  She is also accompanied by an Arabic interpreter.  Kelly Harvey is a 39 year old right-handed woman with a benign medical history with the exception of allergic rhinitis, who presented to the emergency room at [facility] on 11/07/2019 with a 6-day history of intermittent numbness and tingling affecting her left arm and leg as well as in her lips.  She denied any weakness.  She denied any vision changes or speech changes at the time.  She had basic labs done and she had a head CT without contrast on 11/07/2019, I reviewed the results: IMPRESSION: Unremarkable non-contrast CT appearance of the brain. No evidence of acute intracranial abnormality.   I reviewed the emergency room records.  She was noted to be anxious.  An MRI was offered but she declined.   underlying medical history of allergic rhinitis,  She reports that her symptoms started on 11/01/2019.  She reports that symptoms started with sharp chest pain on the left side which lasted for about 2 minutes at the time and then she started having aching sensation in her left shoulder and left elbow down to the hand.  She has had some degree of numbness but can still feel things.  She has had some tingling and aching it is not severe.  She has had intermittent headache as well on the left side.  Some 6 months ago when she was in [place] she had an x-ray of the neck and was told that she had some disc space narrowing in the neck.  She did not have any neck pain at the time and denies any radiating neck pain at this moment. She had intermittent dizziness at the time.  She denies any dizziness or vertiginous  symptoms at this moment. She does not have any weakness.  Her symptoms come and go.  A day after she started having the left arm pain she started having left leg pain.  She has not taken any over-the-counter medication for any of her symptoms.  She denies any history of recurrent headaches or migraine.  She has no photophobia, no blurry vision as such but has had some floaters.  She has not had a formal eye examination in about 4 years, last eye exam was in [place] in 2017 as she recalls.  She has prescription eyeglasses.  She denies any loss of vision or nausea or vomiting.   Her sister reports that she has had an increase in anxiety and stress.  She has an appointment with her primary care physician next week.  She has a history of vitamin D deficiency.  She tries to hydrate well with water, limits her caffeine 2-3 servings per day of coffee or tea.  She lives with her husband and 2 sons.  She does not smoke or drink alcohol.  Her Past Medical History Is Significant For: Past Medical History:  Diagnosis Date  . Allergy   . H/O cesarean section complicating pregnancy 02/23/8525  . History of chicken pox   . Newborn product of in vitro fertilization (IVF) pregnancy     Her Past Surgical History Is Significant For: Past Surgical History:  Procedure  Laterality Date  . CESAREAN SECTION N/A 07/20/2016   Procedure: CESAREAN SECTION;  Surgeon: Janyth Contes, MD;  Location: [place];  Service: Obstetrics;  Laterality: N/A;  . CESAREAN SECTION N/A 11/29/2017   Procedure: REPEAT CESAREAN SECTION;  Surgeon: Janyth Contes, MD;  Location: [place];  Service: Obstetrics;  Laterality: N/A;  Heather,  RNFA    Her Family History Is Significant For: Family History  Problem Relation Age of Onset  . Diabetes Mother   . Hyperlipidemia Mother   . Hypertension Mother   . Miscarriages / [place] Mother   . Hyperlipidemia Father   . Hypertension Father   . Healthy Brother   .  Healthy Son   . Cancer Paternal Aunt        brain  . Healthy Brother   . Healthy Brother   . Healthy Brother     Her Social History Is Significant For: Social History   Socioeconomic History  . Marital status: Married    Spouse name: Not on file  . Number of children: 2  . Years of education: Not on file  . Highest education level: Bachelor's degree (e.g., BA, AB, BS)  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  Tobacco Use  . Smoking status: Never Smoker  . Smokeless tobacco: Former Network engineer  . Vaping Use: Never used  Substance and Sexual Activity  . Alcohol use: No  . Drug use: No  . Sexual activity: Yes  Other Topics Concern  . Not on file  Social History Narrative   Lives with husband, 2 children   Social Determinants of Health   Financial Resource Strain:   . Difficulty of Paying Living Expenses: Not on file  Food Insecurity:   . Worried About Charity fundraiser in the Last Year: Not on file  . Ran Out of Food in the Last Year: Not on file  Transportation Needs:   . Lack of Transportation (Medical): Not on file  . Lack of Transportation (Non-Medical): Not on file  Physical Activity:   . Days of Exercise per Week: Not on file  . Minutes of Exercise per Session: Not on file  Stress:   . Feeling of Stress : Not on file  Social Connections:   . Frequency of Communication with Friends and Family: Not on file  . Frequency of Social Gatherings with Friends and Family: Not on file  . Attends Religious Services: Not on file  . Active Member of Clubs or Organizations: Not on file  . Attends Archivist Meetings: Not on file  . Marital Status: Not on file    Her Allergies Are:  No Known Allergies:   Her Current Medications Are:  Outpatient Encounter Medications as of 11/09/2019  Medication Sig  . Cholecalciferol (VITAMIN D3) 125 MCG (5000 UT) CAPS Take by mouth.  . Omega-3 Fatty Acids (OMEGA 3 PO) Take by mouth.  . hydrocortisone  (ANUSOL-HC) 25 MG suppository Place 1 suppository (25 mg total) rectally 2 (two) times daily as needed for hemorrhoids or anal itching. (Patient not taking: Reported on 11/09/2019)  . ibuprofen (ADVIL,MOTRIN) 800 MG tablet Take 1 tablet (800 mg total) by mouth every 8 (eight) hours as needed for moderate pain. (Patient not taking: Reported on 11/09/2019)  . metroNIDAZOLE (METROGEL) 0.75 % gel Apply 1 application topically 2 (two) times daily. (Patient not taking: Reported on 11/09/2019)  . polyethylene glycol powder (GLYCOLAX/MIRALAX) 17 GM/SCOOP powder Take 17 g by mouth daily. (  Patient not taking: Reported on 11/09/2019)   No facility-administered encounter medications on file as of 11/09/2019.  :   Review of Systems:  Out of a complete 14 point review of systems, all are reviewed and negative with the exception of these symptoms as listed below:    Review of Systems  Neurological:       Rm 1 New patient here with sister, Larena Glassman for numbness and tingling in left arm , hand, left leg, left side of lips, cheek. Began 11/01/19.     Objective:  Neurological Exam  Physical Exam Physical Examination:   Vitals:   11/09/19 0851  BP: (!) 139/91  Pulse: 67    General Examination: The patient is a very pleasant 39 y.o. female in no acute distress. She appears well-developed and well-nourished and well groomed.   HEENT: Normocephalic, atraumatic, pupils are equal, round and reactive to light and accommodation. Funduscopic exam is normal with sharp disc margins noted. Extraocular tracking is good without limitation to gaze excursion or nystagmus noted. Normal smooth pursuit is noted. Hearing is grossly intact. Face is symmetric with normal facial animation and normal facial sensation. Speech is clear with no dysarthria noted. There is no hypophonia. There is no lip, neck/head, jaw or voice tremor. Neck is supple with full range of passive and active motion. There are no carotid bruits on  auscultation. Oropharynx exam reveals: no signif. mouth dryness, good dental hygiene.  Tongue protrudes centrally and palate elevates symmetrically.   Chest: Clear to auscultation without wheezing, rhonchi or crackles noted.  Heart: S1+S2+0, regular and normal without murmurs, rubs or gallops noted.   Abdomen: Soft, non-tender and non-distended with normal bowel sounds appreciated on auscultation.  Extremities: There is no pitting edema in the distal lower extremities bilaterally. Pedal pulses are intact.  Skin: Warm and dry without trophic changes noted.  Musculoskeletal: exam reveals no obvious joint deformities, tenderness or joint swelling or erythema.   Neurologically:  Mental status: The patient is awake, alert and oriented in all 4 spheres. Her immediate and remote memory, attention, language skills and fund of knowledge are appropriate. There is no evidence of aphasia, agnosia, apraxia or anomia. Speech is clear with normal prosody and enunciation. Thought process is linear. Mood is normal and affect is normal.  Cranial nerves II - XII are as described above under HEENT exam. In addition: shoulder shrug is normal with equal shoulder height noted. Motor exam: Normal bulk, strength and tone is noted. There is no drift, tremor or rebound. Romberg is negative. Reflexes are 2+ to 3+ throughout. Babinski: Toes are flexor bilaterally. Fine motor skills and coordination: intact with normal finger taps, normal hand movements, normal rapid alternating patting, normal foot taps and normal foot agility.  Cerebellar testing: No dysmetria or intention tremor on finger to nose testing. Heel to shin is unremarkable bilaterally. There is no truncal or gait ataxia.  Sensory exam: intact to light touch, pinprick, vibration, temperature sense in the upper and lower extremities.  Gait, station and balance: She stands easily. No veering to one side is noted. No leaning to one side is noted. Posture is  age-appropriate and stance is narrow based. Gait shows normal stride length and normal pace. No problems turning are noted. Tandem walk is unremarkable.               Assessment and Plan:   In summary, Kelly Harvey is a very pleasant 39 y.o.-year old female with a benign medical history, who presents for  evaluation of intermittent numbness and pain sensation in the left hemibody and around the left side of her mouth.  Her exam is nonfocal and intact, in fact, her neurological exam is completely normal.  She is reassured in that regard.  She had a recent head CT through the emergency room and basic labs.  She also had an EKG.  Nevertheless, she is advised to talk to her primary care physician about evaluation through cardiology as she reported new onset chest pain which preceded her intermittent left-sided paresthesias.  She also later reported a history of passing out when she was on a plane, when she travel to [place] in December of last year.  She does add that she was under a lot of stress, was not eating and drinking very well and was very tired at the time.  She is advised that her symptoms can be due to several causes.  No obvious cause is identified thus far.  History and presentation are not telltale for complex migraines.  She does report significant stress and anxiety and is encouraged to talk to her primary care physician next week about these issues including her vitamin D deficiency.  We will go ahead and do some blood work today to look at thyroid function, B12, and inflammatory as well as autoimmune markers.  She is also advised that I would like to go ahead and proceed with a brain MRI with and without contrast to rule out an inflammatory or structural cause of her symptoms.  We will call her to schedule her brain MRI and also keep her posted as to her test results by phone call.  We will follow-up in this clinic if needed.  She is advised to seek evaluation through an eye doctor for formal eye  examination as she reported floaters and has not had an eye examination in about 4 years.  She does have prescription eyeglasses which she did not have on today.   I answered all the questions today and the patient and her sister were in agreement.  Star Age, MD, PhD

## 2019-11-13 ENCOUNTER — Encounter: Payer: Self-pay | Admitting: Family Medicine

## 2019-11-13 ENCOUNTER — Other Ambulatory Visit: Payer: Self-pay

## 2019-11-13 ENCOUNTER — Ambulatory Visit (INDEPENDENT_AMBULATORY_CARE_PROVIDER_SITE_OTHER): Payer: 59 | Admitting: Family Medicine

## 2019-11-13 VITALS — BP 112/81 | HR 71 | Temp 98.7°F | Resp 20 | Ht 65.0 in | Wt 146.8 lb

## 2019-11-13 DIAGNOSIS — Z23 Encounter for immunization: Secondary | ICD-10-CM

## 2019-11-13 DIAGNOSIS — E559 Vitamin D deficiency, unspecified: Secondary | ICD-10-CM | POA: Diagnosis not present

## 2019-11-13 DIAGNOSIS — F418 Other specified anxiety disorders: Secondary | ICD-10-CM

## 2019-11-13 DIAGNOSIS — Z1159 Encounter for screening for other viral diseases: Secondary | ICD-10-CM

## 2019-11-13 DIAGNOSIS — R202 Paresthesia of skin: Secondary | ICD-10-CM

## 2019-11-13 LAB — VITAMIN D 25 HYDROXY (VIT D DEFICIENCY, FRACTURES): VITD: 41.33 ng/mL (ref 30.00–100.00)

## 2019-11-13 MED ORDER — SERTRALINE HCL 25 MG PO TABS: 25.0000 mg | ORAL_TABLET | Freq: Every day | ORAL | 3 refills | Status: DC

## 2019-11-13 NOTE — Assessment & Plan Note (Signed)
Pt has hx of this.  Check labs to determine if this is contributing to her fatigue

## 2019-11-13 NOTE — Assessment & Plan Note (Addendum)
New.  Pt scored 12 on PHQ9 today.  She has had a very stressful year and does not have much understanding at home.  She is not eating well or sleeping well.  She has very limited self care. She is here with a friend today who is very supportive.  She has limited english so the idea of counseling is overwhelming to her.  Will start low dose medication and monitor for improvement.  Titrating as needed

## 2019-11-13 NOTE — Assessment & Plan Note (Signed)
Pt was recently seen in ER and by Neurology.  No obvious cause of sxs found despite extensive lab work by neurology.  MRI of brain pending.  Will follow along.

## 2019-11-13 NOTE — Patient Instructions (Signed)
Follow up in 3-4 months to recheck mood We'll notify you of your lab results and make any changes if needed START the Sertraline 25mg  once daily- take w/ food Drink LOTS of water and change positions slowly to help w/ the dizziness Make sure you are eating regularly throughout the day- at least 3 meals and likely snacks between them Call with any questions or concerns Hang in there!!!

## 2019-11-13 NOTE — Progress Notes (Signed)
   Subjective:    Patient ID: Kelly Harvey, female    DOB: August 13, 1980, 39 y.o.   MRN: [id number]  HPI ER f/u- pt went to ER on 10/19 for numbness and tingling of L arm and leg as well as lip tingling.  CT of head was WNL.  MRI was offered but she decided to f/u w/ neuro as an outpt.  Pt saw Dr Rexene Alberts on 10/21 for her numbness and tingling.  Neuro ordered labs and MRI of brain.  She was advised to have a formal eye exam and encouraged to discuss her anxiety/depression at today's appt.  Today pt reports numbness is less than previously but 'I feel weak'.  Upon clarification, pt reports feeling tired and episodic dizziness.  She reports dizziness occurs most commonly w/ position changes.  Pt reports good water intake.  Spent 6 months in [place], living w/ her parents, raising her 2 sons, w/o any space of her own.  She returned to the [place] in May and has been very isolated.  The boys are sleeping with her so she doesn't get good sleep.  Husband is not helpful with the boys.  Decreased enjoyment and interest in things she used to enjoy.  + racing thoughts.   Review of Systems For ROS see HPI   This visit occurred during the SARS-CoV-2 public health emergency.  Safety protocols were in place, including screening questions prior to the visit, additional usage of staff PPE, and extensive cleaning of exam room while observing appropriate contact time as indicated for disinfecting solutions.       Objective:   Physical Exam Vitals reviewed.  Constitutional:      General: She is not in acute distress.    Appearance: Normal appearance. She is not ill-appearing.  HENT:     Head: Normocephalic and atraumatic.  Cardiovascular:     Rate and Rhythm: Normal rate and regular rhythm.  Pulmonary:     Effort: Pulmonary effort is normal. No respiratory distress.  Skin:    General: Skin is warm and dry.  Neurological:     General: No focal deficit present.     Mental Status: She is alert and oriented to person,  place, and time.  Psychiatric:     Comments: Flat affect, withdrawn           Assessment & Plan:

## 2019-11-14 LAB — VITAMIN B1: Thiamine: 123.2 nmol/L (ref 66.5–200.0)

## 2019-11-14 LAB — VITAMIN B6: Vitamin B6: 7.4 ug/L (ref 2.0–32.8)

## 2019-11-14 LAB — RHEUMATOID FACTOR: Rheumatoid fact SerPl-aCnc: <10 IU/mL (ref 0.0–13.9)

## 2019-11-14 LAB — HGB A1C W/O EAG: Hgb A1c MFr Bld: 5.4 % (ref 4.8–5.6)

## 2019-11-14 LAB — TSH: TSH: 2.74 u[IU]/mL (ref 0.450–4.500)

## 2019-11-14 LAB — B12 AND FOLATE PANEL
Folate: 8.1 ng/mL (ref >3.0)
Vitamin B-12: 654 pg/mL (ref 232–1245)

## 2019-11-14 LAB — C-REACTIVE PROTEIN: CRP: 1 mg/L (ref 0–10)

## 2019-11-14 LAB — SEDIMENTATION RATE: Sed Rate: 4 mm/hr (ref 0–32)

## 2019-11-14 LAB — HEPATITIS C ANTIBODY
Hepatitis C Ab: NONREACTIVE
SIGNAL TO CUT-OFF: 0.07 (ref <1.00)

## 2019-11-14 LAB — ANA W/REFLEX: Anti Nuclear Antibody (ANA): NEGATIVE

## 2019-11-15 ENCOUNTER — Telehealth: Payer: Self-pay | Admitting: *Deleted

## 2019-11-15 NOTE — Telephone Encounter (Signed)
-----   Message from Star Age, MD sent at 11/15/2019  4:45 PM EDT ----- Arabic interpreter needed.   Please advise patient that her labs were normal.

## 2019-11-15 NOTE — Telephone Encounter (Signed)
I called the patient through [employer]. Her husband on DPR answered the phone and was provided with the lab results. Our number was given in the case the patient had any questions.

## 2019-11-15 NOTE — Progress Notes (Signed)
Arabic interpreter needed.   Please advise patient that her labs were normal.

## 2019-11-28 ENCOUNTER — Ambulatory Visit
Admission: RE | Admit: 2019-11-28 | Discharge: 2019-11-28 | Disposition: A | Discharge status: Home / Self Care | Payer: 59 | Source: Ambulatory Visit | Attending: Neurology | Admitting: Neurology

## 2019-11-28 ENCOUNTER — Telehealth: Payer: Self-pay

## 2019-11-28 DIAGNOSIS — R52 Pain, unspecified: Secondary | ICD-10-CM

## 2019-11-28 DIAGNOSIS — R200 Anesthesia of skin: Secondary | ICD-10-CM

## 2019-11-28 DIAGNOSIS — R202 Paresthesia of skin: Secondary | ICD-10-CM | POA: Diagnosis not present

## 2019-11-28 DIAGNOSIS — F439 Reaction to severe stress, unspecified: Secondary | ICD-10-CM

## 2019-11-28 DIAGNOSIS — F419 Anxiety disorder, unspecified: Secondary | ICD-10-CM

## 2019-11-28 MED ORDER — GADOBENATE DIMEGLUMINE 529 MG/ML IV SOLN
13.0000 mL | Freq: Once | INTRAVENOUS | Status: AC | PRN
  Administered 2019-11-28: 13 mL via INTRAVENOUS

## 2019-11-28 NOTE — Telephone Encounter (Signed)
-----   Message from Kelly Age, MD sent at 11/28/2019  3:23 PM EST ----- Interpreter needed: please advise patient that her Brain MRI w/wo contrast was reported as normal. As discussed, I recommend FU with primary care.

## 2019-11-28 NOTE — Telephone Encounter (Signed)
I called pt using [employer], [place] # [phone]. Pt's husband, Solicitor, per [facility] answered. I discussed MRI results and recommendations with him. Pt's husband verbalized understanding and had no questions at this time but was encouraged to call back if questions arise.

## 2019-11-28 NOTE — Progress Notes (Signed)
Interpreter needed: please advise patient that her Brain MRI w/wo contrast was reported as normal. As discussed, I recommend FU with primary care.

## 2019-12-05 ENCOUNTER — Other Ambulatory Visit: Payer: Self-pay

## 2019-12-05 ENCOUNTER — Encounter: Payer: Self-pay | Admitting: Family Medicine

## 2019-12-05 ENCOUNTER — Other Ambulatory Visit: Payer: Self-pay | Admitting: Family Medicine

## 2019-12-05 ENCOUNTER — Ambulatory Visit (INDEPENDENT_AMBULATORY_CARE_PROVIDER_SITE_OTHER): Payer: 59 | Admitting: Family Medicine

## 2019-12-05 VITALS — BP 108/60 | HR 52 | Temp 98.5°F | Resp 17 | Ht 65.0 in | Wt 152.0 lb

## 2019-12-05 DIAGNOSIS — E663 Overweight: Secondary | ICD-10-CM | POA: Diagnosis not present

## 2019-12-05 DIAGNOSIS — F418 Other specified anxiety disorders: Secondary | ICD-10-CM

## 2019-12-05 DIAGNOSIS — R202 Paresthesia of skin: Secondary | ICD-10-CM | POA: Diagnosis not present

## 2019-12-05 NOTE — Assessment & Plan Note (Signed)
Ongoing issue.  Pt has gained 5 lb since last visit.  She is exercising regularly but I suspect that her appetite is better since her mood improved.  Will continue to follow.

## 2019-12-05 NOTE — Patient Instructions (Signed)
Schedule your complete physical in 6 months Continue to work on healthy diet and regular exercise- you're doing great! I think the numbness is coming from neck tightness and will continue to improve w/ exercise and relaxation Please get your COVID vaccines Call with any questions or concerns Stay Safe!  Stay Healthy!

## 2019-12-05 NOTE — Assessment & Plan Note (Signed)
Improved.  Pt stopped the medication b/c she states that w/ better sleep, eating regularly, and daily exercise she is feeling better.  She is not interested in medication at this time.  Will follow.

## 2019-12-05 NOTE — Progress Notes (Signed)
   Subjective:    Patient ID: Kelly Harvey, female    DOB: 15-Aug-1980, 39 y.o.   MRN: [id number]  HPI Depression/anxiety- pt was started on Sertraline 25mg  3 weeks ago due to mood.  Pt reports she is sleeping well, eating well, exercising daily.  She stopped the medication- not due to side effects- but b/c she wanted to try without it.  Reports feeling better.  Pt is not interested in medication at this time.  Overweight- pt has gained 5 lbs.    Numbness/tingling- pt reports she had normal MRI w/ neuro.  Pt has some neck pain- particularly w/ bending over or lifting something.  She reports numbness/tingling have been less since she started exercising.   Review of Systems For ROS see HPI   This visit occurred during the SARS-CoV-2 public health emergency.  Safety protocols were in place, including screening questions prior to the visit, additional usage of staff PPE, and extensive cleaning of exam room while observing appropriate contact time as indicated for disinfecting solutions.       Objective:   Physical Exam Vitals reviewed.  Constitutional:      General: She is not in acute distress.    Appearance: Normal appearance. She is well-developed.  HENT:     Head: Normocephalic and atraumatic.  Eyes:     Conjunctiva/sclera: Conjunctivae normal.     Pupils: Pupils are equal, round, and reactive to light.  Neck:     Thyroid: No thyromegaly.  Cardiovascular:     Rate and Rhythm: Normal rate and regular rhythm.     Pulses: Normal pulses.     Heart sounds: Normal heart sounds. No murmur heard.   Pulmonary:     Effort: Pulmonary effort is normal. No respiratory distress.     Breath sounds: Normal breath sounds.  Abdominal:     General: There is no distension.     Palpations: Abdomen is soft.     Tenderness: There is no abdominal tenderness.  Musculoskeletal:     Cervical back: Normal range of motion and neck supple.     Right lower leg: No edema.     Left lower leg: No edema.    Lymphadenopathy:     Cervical: No cervical adenopathy.  Skin:    General: Skin is warm and dry.  Neurological:     Mental Status: She is alert and oriented to person, place, and time.  Psychiatric:        Behavior: Behavior normal.           Assessment & Plan:

## 2019-12-05 NOTE — Telephone Encounter (Signed)
Patient is requesting the following for a 90 day supply  Zoloft 25 mg  90 tabs  2 refills  Last refill 11/13/2019

## 2019-12-05 NOTE — Assessment & Plan Note (Signed)
Pt's numbness and tingling has improved considerably w/ relaxation and exercise.  Suspect a lot of her sxs were due to neck tightness.  Her MRI brain was normal.  Pt is very relieved.  Will follow.

## 2019-12-12 ENCOUNTER — Ambulatory Visit (INDEPENDENT_AMBULATORY_CARE_PROVIDER_SITE_OTHER): Payer: 59 | Admitting: Family Medicine

## 2019-12-12 ENCOUNTER — Encounter: Payer: Self-pay | Admitting: Family Medicine

## 2019-12-12 ENCOUNTER — Other Ambulatory Visit: Payer: Self-pay

## 2019-12-12 VITALS — BP 118/70 | HR 60 | Temp 98.2°F | Resp 19 | Ht 65.0 in | Wt 150.0 lb

## 2019-12-12 DIAGNOSIS — Z0000 Encounter for general adult medical examination without abnormal findings: Secondary | ICD-10-CM

## 2019-12-12 NOTE — Patient Instructions (Addendum)
Follow up in 1 year or as needed No need for labs today- yay!!! Keep up the good work on healthy diet and regular exercise- you look great!!! Please have GYN send me a copy of your pap results Call with any questions or concerns Stay Safe!  Stay Healthy! Happy Holidays!!!!

## 2019-12-12 NOTE — Progress Notes (Signed)
   Subjective:    Patient ID: Kelly Harvey, female    DOB: March 26, 1980, 39 y.o.   MRN: [id number]  HPI CPE- UTD on Tdap, flu.  Has pap scheduled.  Declines COVID.  Reviewed past medical, surgical, family and social histories.   Health Maintenance  Topic Date Due  . PAP SMEAR-Modifier  01/10/2020 (Originally 08/28/2019)  . TETANUS/TDAP  05/09/2026  . INFLUENZA VACCINE  Completed  . Hepatitis C Screening  Completed  . HIV Screening  Completed      Review of Systems Patient reports no vision/ hearing changes, adenopathy,fever, weight change,  persistant/recurrent hoarseness , swallowing issues, chest pain, palpitations, edema, persistant/recurrent cough, hemoptysis, dyspnea (rest/exertional/paroxysmal nocturnal), gastrointestinal bleeding (melena, rectal bleeding), abdominal pain, significant heartburn, bowel changes, GU symptoms (dysuria, hematuria, incontinence), Gyn symptoms (abnormal  bleeding, pain),  syncope, focal weakness, memory loss, numbness & tingling, skin/hair/nail changes, abnormal bruising or bleeding, anxiety, or depression.   This visit occurred during the SARS-CoV-2 public health emergency.  Safety protocols were in place, including screening questions prior to the visit, additional usage of staff PPE, and extensive cleaning of exam room while observing appropriate contact time as indicated for disinfecting solutions.       Objective:   Physical Exam General Appearance:    Alert, cooperative, no distress, appears stated age  Head:    Normocephalic, without obvious abnormality, atraumatic  Eyes:    PERRL, conjunctiva/corneas clear, EOM's intact, fundi    benign, both eyes  Ears:    Normal TM's and external ear canals, both ears  Nose:   Deferred due to COVID  Throat:   Neck:   Supple, symmetrical, trachea midline, no adenopathy;    Thyroid: no enlargement/tenderness/nodules  Back:     Symmetric, no curvature, ROM normal, no CVA tenderness  Lungs:     Clear to  auscultation bilaterally, respirations unlabored  Chest Wall:    No tenderness or deformity   Heart:    Regular rate and rhythm, S1 and S2 normal, no murmur, rub   or gallop  Breast Exam:    Deferred to GYN  Abdomen:     Soft, non-tender, bowel sounds active all four quadrants,    no masses, no organomegaly  Genitalia:    Deferred to GYN  Rectal:    Extremities:   Extremities normal, atraumatic, no cyanosis or edema  Pulses:   2+ and symmetric all extremities  Skin:   Skin color, texture, turgor normal, no rashes or lesions  Lymph nodes:   Cervical, supraclavicular, and axillary nodes normal  Neurologic:   CNII-XII intact, normal strength, sensation and reflexes    throughout          Assessment & Plan:

## 2019-12-12 NOTE — Assessment & Plan Note (Signed)
Pt's PE WNL.  UTD on Tdap, flu.  Has pap scheduled next month.  No need to repeat labs.  Anticipatory guidance provided.

## 2020-05-15 ENCOUNTER — Encounter: Payer: Self-pay | Admitting: Family Medicine

## 2020-05-15 ENCOUNTER — Other Ambulatory Visit: Payer: Self-pay

## 2020-05-15 ENCOUNTER — Ambulatory Visit (INDEPENDENT_AMBULATORY_CARE_PROVIDER_SITE_OTHER): Payer: 59 | Admitting: Family Medicine

## 2020-05-15 ENCOUNTER — Telehealth: Payer: Self-pay | Admitting: Family Medicine

## 2020-05-15 VITALS — BP 114/70 | HR 63 | Temp 98.7°F | Resp 20 | Ht 65.0 in | Wt 151.2 lb

## 2020-05-15 DIAGNOSIS — M5410 Radiculopathy, site unspecified: Secondary | ICD-10-CM | POA: Diagnosis not present

## 2020-05-15 DIAGNOSIS — L719 Rosacea, unspecified: Secondary | ICD-10-CM

## 2020-05-15 DIAGNOSIS — R109 Unspecified abdominal pain: Secondary | ICD-10-CM

## 2020-05-15 DIAGNOSIS — K219 Gastro-esophageal reflux disease without esophagitis: Secondary | ICD-10-CM

## 2020-05-15 DIAGNOSIS — G8929 Other chronic pain: Secondary | ICD-10-CM

## 2020-05-15 DIAGNOSIS — R319 Hematuria, unspecified: Secondary | ICD-10-CM | POA: Diagnosis not present

## 2020-05-15 DIAGNOSIS — M545 Low back pain, unspecified: Secondary | ICD-10-CM

## 2020-05-15 DIAGNOSIS — R10A2 Flank pain, left side: Secondary | ICD-10-CM

## 2020-05-15 LAB — POCT URINALYSIS DIPSTICK
Bilirubin, UA: NEGATIVE
Blood, UA: 3
Glucose, UA: NEGATIVE
Ketones, UA: NEGATIVE
Leukocytes, UA: NEGATIVE
Nitrite, UA: NEGATIVE
Protein, UA: POSITIVE — AB
Spec Grav, UA: 1.02 (ref 1.010–1.025)
Urobilinogen, UA: 0.2 E.U./dL
pH, UA: 5 (ref 5.0–8.0)

## 2020-05-15 MED ORDER — METRONIDAZOLE 0.75 % EX GEL
1.0000 | Freq: Two times a day (BID) | CUTANEOUS | 1 refills | Status: DC
Start: 2020-05-15 — End: 2021-01-01

## 2020-05-15 MED ORDER — OMEPRAZOLE 40 MG PO CPDR: 40.0000 mg | DELAYED_RELEASE_CAPSULE | Freq: Every day | ORAL | 3 refills | Status: DC

## 2020-05-15 MED ORDER — PREDNISONE 10 MG PO TABS: ORAL_TABLET | ORAL | 0 refills | Status: DC

## 2020-05-15 MED ORDER — TRAMADOL HCL 50 MG PO TABS: 50.0000 mg | ORAL_TABLET | Freq: Three times a day (TID) | ORAL | 0 refills | Status: AC | PRN

## 2020-05-15 NOTE — Patient Instructions (Signed)
Follow up as needed or as scheduled Please see Erica up front about your CT scan START the Prednisone as directed in case of musculoskeletal low back pain- 3 pills at the same time x3 days, then 2 pills at the same time x3 days, and then 1 pill daily.  Take with food. DO NOT take any ibuprofen/motrin/aleve/etc while on the Prednisone You CAN add Acetaminophen (tylenol) for pain TAKE the Tramadol as needed for severe pain Drink LOTS of water Call with any questions or concerns Hang in there!!

## 2020-05-15 NOTE — Telephone Encounter (Signed)
Patient's husband called and would like someone to call him and explain the results of her urinalysis - they have looked at the results on my chart but do not understand all of it.  Please call.

## 2020-05-15 NOTE — Telephone Encounter (Signed)
I responded via MyChart that she has blood in her urine (3+) which can cause the protein to also be positive.

## 2020-05-15 NOTE — Progress Notes (Signed)
   Subjective:    Patient ID: Kelly Harvey, female    DOB: 1980-12-20, 40 y.o.   MRN: [id number]  HPI Back pain- sxs started 2 weeks ago.  L sided.  Pt has been using heat w/o relief.  Saw GYN and was told to take ibuprofen 800mg .  This provides temporary relief.  Pain will radiate around to abd/groin.  At times will radiate down front of leg.  Pain has made her cry.  Pain w/ changing positions.  Can feel back pain when she moves her foot.  Pt reports she is still having some menstrual bleeding.  No burning w/ urination.  Pain is localized to L lower back, flank, abd.  No hx of something similar.  No hx of kidney stones.  Due to NSAID use, pt also wants something for GERD.  Review of Systems For ROS see HPI   This visit occurred during the SARS-CoV-2 public health emergency.  Safety protocols were in place, including screening questions prior to the visit, additional usage of staff PPE, and extensive cleaning of exam room while observing appropriate contact time as indicated for disinfecting solutions.       Objective:   Physical Exam Vitals reviewed.  Constitutional:      General: She is not in acute distress.    Appearance: Normal appearance. She is not ill-appearing.  HENT:     Head: Normocephalic and atraumatic.  Abdominal:     General: Abdomen is flat. There is no distension.     Palpations: Abdomen is soft.     Tenderness: There is abdominal tenderness (TTP over L flank). There is no right CVA tenderness, left CVA tenderness, guarding or rebound.  Musculoskeletal:        General: Tenderness (TTP over lumbar spine and L paraspinal muscle) present.  Skin:    General: Skin is warm and dry.  Neurological:     General: No focal deficit present.     Mental Status: She is alert and oriented to person, place, and time.     Cranial Nerves: No cranial nerve deficit.     Motor: No weakness.     Coordination: Coordination normal.     Gait: Gait normal.     Deep Tendon Reflexes: Reflexes  normal.     Comments: + SLR on L, (-) on R  Psychiatric:        Mood and Affect: Mood normal.        Behavior: Behavior normal.        Thought Content: Thought content normal.           Assessment & Plan:  L back/flank pain- new.  Sxs started ~10 days ago.  Reports pain started in back but she now has L flank pain that will radiate around to abdomen.  LBP remains constant and will radiate down L leg.  She has large amount of hematuria on urine dipstick but unclear if this is actually urine or vaginal d/c.  She definitely has radicular LBP but she could also have a stone based on the flank pain, radiating abdominal pain, and hematuria.  Will treat musculoskeletal pain w/ steroids and Tramadol and need to get imaging to assess for stone given her sxs and clinical findings.  If stone present, will refer to urology for management.  Pt expressed understanding and is in agreement w/ plan.   GERD- likely due to recent NSAID use.  Start Omeprazole 40mg  daily  Rosacea- refill on Metrogel sent to pharmacy

## 2020-05-16 LAB — URINE CULTURE
MICRO NUMBER:: 11822196
Result:: NO GROWTH
SPECIMEN QUALITY:: ADEQUATE

## 2020-05-21 ENCOUNTER — Other Ambulatory Visit: Payer: Self-pay

## 2020-05-21 ENCOUNTER — Ambulatory Visit (HOSPITAL_BASED_OUTPATIENT_CLINIC_OR_DEPARTMENT_OTHER)
Admission: RE | Admit: 2020-05-21 | Discharge: 2020-05-21 | Disposition: A | Discharge status: Home / Self Care | Payer: 59 | Source: Ambulatory Visit | Attending: Family Medicine | Admitting: Family Medicine

## 2020-05-21 DIAGNOSIS — R109 Unspecified abdominal pain: Secondary | ICD-10-CM | POA: Diagnosis present

## 2020-05-21 DIAGNOSIS — R319 Hematuria, unspecified: Secondary | ICD-10-CM | POA: Insufficient documentation

## 2020-06-03 ENCOUNTER — Other Ambulatory Visit: Payer: Self-pay

## 2020-06-03 DIAGNOSIS — G8929 Other chronic pain: Secondary | ICD-10-CM

## 2020-06-24 ENCOUNTER — Other Ambulatory Visit: Payer: Self-pay | Admitting: Obstetrics and Gynecology

## 2020-06-24 DIAGNOSIS — N6320 Unspecified lump in the left breast, unspecified quadrant: Secondary | ICD-10-CM

## 2020-07-05 ENCOUNTER — Other Ambulatory Visit: Payer: Self-pay | Admitting: Obstetrics and Gynecology

## 2020-07-05 ENCOUNTER — Ambulatory Visit
Admission: RE | Admit: 2020-07-05 | Discharge: 2020-07-05 | Disposition: A | Discharge status: Home / Self Care | Payer: 59 | Source: Ambulatory Visit | Attending: Obstetrics and Gynecology | Admitting: Obstetrics and Gynecology

## 2020-07-05 ENCOUNTER — Other Ambulatory Visit: Payer: Self-pay

## 2020-07-05 DIAGNOSIS — N6320 Unspecified lump in the left breast, unspecified quadrant: Secondary | ICD-10-CM

## 2020-07-08 ENCOUNTER — Ambulatory Visit
Admission: RE | Admit: 2020-07-08 | Discharge: 2020-07-08 | Disposition: A | Discharge status: Home / Self Care | Payer: 59 | Source: Ambulatory Visit | Attending: Obstetrics and Gynecology | Admitting: Obstetrics and Gynecology

## 2020-07-08 ENCOUNTER — Other Ambulatory Visit: Payer: Self-pay

## 2020-07-08 DIAGNOSIS — N6320 Unspecified lump in the left breast, unspecified quadrant: Secondary | ICD-10-CM

## 2020-07-11 NOTE — Progress Notes (Signed)
[facility] [address] [place] Phone: [phone] Subjective:   I Kelly Harvey am serving as a Education administrator for Dr. Hulan Saas.  This visit occurred during the SARS-CoV-2 public health emergency.  Safety protocols were in place, including screening questions prior to the visit, additional usage of staff PPE, and extensive cleaning of exam room while observing appropriate contact time as indicated for disinfecting solutions.   I'm seeing this patient by the request  of:  [name], MD  CC: Back pain  [id number]  Kelly Harvey is a 39 y.o. female coming in with complaint of low back pain. Patient states she also has pain on the lateral left foot. Numbness. Foot has been bothering her for the last 2-3 days. Standing makes the foot worse.   Onset- 1.5 months  Location - left sided in the back and neck Duration- pain the last 2 days  Character- stiffness  Aggravating factors- rotation, back flexion  Reliving factors-  Therapies tried- Tylenol, Ibuprofen  Severity- 7/10  Patient is accompanied with a Optometrist.    Past Medical History:  Diagnosis Date   Allergy    H/O cesarean section complicating pregnancy 16/09/6787   History of chicken pox    Newborn product of in vitro fertilization (IVF) pregnancy    Past Surgical History:  Procedure Laterality Date   CESAREAN SECTION N/A 07/20/2016   Procedure: CESAREAN SECTION;  Surgeon: Kelly Contes, MD;  Location: [facility];  Service: Obstetrics;  Laterality: N/A;   CESAREAN SECTION N/A 11/29/2017   Procedure: REPEAT CESAREAN SECTION;  Surgeon: Kelly Contes, MD;  Location: [facility];  Service: Obstetrics;  Laterality: N/A;  Kelly Harvey,  RNFA   Social History   Socioeconomic History   Marital status: Married    Spouse name: Kelly Harvey   Number of children: 2   Years of education: Not on file   Highest education level: Bachelor's degree (e.g., BA,  AB, BS)  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  Tobacco Use   Smoking status: Never   Smokeless tobacco: Former  Scientific laboratory technician Use: Never used  Substance and Sexual Activity   Alcohol use: No   Drug use: No   Sexual activity: Yes  Other Topics Concern   Not on file  Social History Narrative   Lives with husband, 2 children   Social Determinants of Health   Financial Resource Strain: Not on file  Food Insecurity: Not on file  Transportation Needs: Not on file  Physical Activity: Not on file  Stress: Not on file  Social Connections: Not on file   No Known Allergies Family History  Problem Relation Age of Onset   Diabetes Mother    Hyperlipidemia Mother    Hypertension Mother    Miscarriages / [place] Mother    Hyperlipidemia Father    Hypertension Father    Healthy Brother    Healthy Son    Cancer Paternal Aunt        brain   Healthy Brother    Healthy Brother    Healthy Brother     Current Outpatient Medications (Endocrine & Metabolic):    metFORMIN (GLUCOPHAGE) 500 MG tablet, Take 2 tablets by mouth 2 (two) times daily.   predniSONE (DELTASONE) 10 MG tablet, 3 tabs x3 days and then 2 tabs x3 days and then 1 tab x3 days.  Take w/ food.   SPRINTEC 28 0.25-35 MG-MCG tablet,  Current Outpatient Medications (Analgesics):    ibuprofen (ADVIL) 800 MG tablet, Take 800 mg by mouth 3 (three) times daily.   Current Outpatient Medications (Other):    Cholecalciferol (VITAMIN D3) 125 MCG (5000 UT) CAPS, Take by mouth.   metroNIDAZOLE (METROGEL) 0.75 % gel, Apply 1 application topically 2 (two) times daily.   Omega-3 Fatty Acids (OMEGA 3 PO), Take by mouth.   omeprazole (PRILOSEC) 40 MG capsule, Take 1 capsule (40 mg total) by mouth daily.   Reviewed prior external information including notes and imaging from  primary care provider As well as notes that were available from care everywhere and other healthcare systems.  Past medical history,  social, surgical and family history all reviewed in electronic medical record.  No pertanent information unless stated regarding to the chief complaint.   Review of Systems:  No headache, visual changes, nausea, vomiting, diarrhea, constipation, dizziness, abdominal pain, skin rash, fevers, chills, night sweats, weight loss, swollen lymph nodes,  joint swelling, chest pain, shortness of breath, mood changes. POSITIVE muscle aches, body aches  Objective  Blood pressure 122/86, pulse 63, height 5\' 5"  (1.651 m), weight 151 lb (68.5 kg), last menstrual period 06/20/2020, SpO2 100 %.   General: No apparent distress alert and oriented x3 mood and affect normal, dressed appropriately.  HEENT: Pupils equal, extraocular movements intact  Respiratory: Patient's speak in full sentences and does not appear short of breath  Cardiovascular: No lower extremity edema, non tender, no erythema  Gait normal with good balance and coordination.  MSK: Patient's low back exam does have some tightness noted with FABER test.  Tender to palpation in the paraspinal musculature mostly around the left sacroiliac joint.  Negative straight leg test.  Full flexion and extension noted.  Patient also has tenderness to palpation minorly over the left side of her neck.  Negative Spurling's test.  5 out of 5 strength of the upper extremities. Left foot exam shows that patient does have what appears to be a ganglion cyst over the lateral aspect.  No erythema, no sign of infectious etiology.  Limited musculoskeletal ultrasound was performed and interpreted by [name]  Limited ultrasound of patient's left foot over the mass in question shows that this is a hypoechoic change.  Consistent with a ganglion cyst. Impression: Likely ganglion cyst of the left foot   Impression and Recommendations:     The above documentation has been reviewed and is accurate and complete [name], DO

## 2020-07-12 ENCOUNTER — Other Ambulatory Visit: Payer: Self-pay

## 2020-07-12 ENCOUNTER — Encounter: Payer: Self-pay | Admitting: Family Medicine

## 2020-07-12 ENCOUNTER — Ambulatory Visit: Payer: Self-pay

## 2020-07-12 ENCOUNTER — Ambulatory Visit (INDEPENDENT_AMBULATORY_CARE_PROVIDER_SITE_OTHER): Payer: 59

## 2020-07-12 ENCOUNTER — Ambulatory Visit (INDEPENDENT_AMBULATORY_CARE_PROVIDER_SITE_OTHER): Payer: 59 | Admitting: Family Medicine

## 2020-07-12 VITALS — BP 122/86 | HR 63 | Ht 65.0 in | Wt 151.0 lb

## 2020-07-12 DIAGNOSIS — M542 Cervicalgia: Secondary | ICD-10-CM

## 2020-07-12 DIAGNOSIS — M67479 Ganglion, unspecified ankle and foot: Secondary | ICD-10-CM | POA: Diagnosis not present

## 2020-07-12 DIAGNOSIS — G8929 Other chronic pain: Secondary | ICD-10-CM | POA: Diagnosis not present

## 2020-07-12 DIAGNOSIS — M545 Low back pain, unspecified: Secondary | ICD-10-CM

## 2020-07-12 DIAGNOSIS — M79672 Pain in left foot: Secondary | ICD-10-CM | POA: Diagnosis not present

## 2020-07-12 NOTE — Assessment & Plan Note (Signed)
Low back exam does show that patient has some mild tightness.  Think it is more secondary to multifactorial.  Patient does have 2 young children and is on the floor a significant amount.  Discussed with patient about icing regimen and home exercises.  Patient will also consider the possibility of physical therapy but would like to try it at home.  First.  Discussed icing regimen and over-the-counter natural supplements.  Follow-up with me again 6 to 8 weeks

## 2020-07-12 NOTE — Assessment & Plan Note (Signed)
Ganglion cyst of the left foot.  Discussed with patient at this time that if it enlarges can consider aspiration.  Patient wants to leave that and see how it goes.

## 2020-07-12 NOTE — Assessment & Plan Note (Signed)
Multifactorial as well.  Patient does not have any radicular symptoms at the moment.  Discussed posture and ergonomics and given some scapular stability exercises.

## 2020-07-12 NOTE — Patient Instructions (Addendum)
Good to see you Back and neck xray Scapular exercises and low back Turmeric 500mg  daily  Tart cherry extract 1200mg  at night Vitamin D 2000 IU daily  See me again in 6 weeks

## 2020-07-17 ENCOUNTER — Encounter: Payer: Self-pay | Admitting: *Deleted

## 2020-08-02 ENCOUNTER — Other Ambulatory Visit: Payer: 59

## 2020-08-10 ENCOUNTER — Other Ambulatory Visit: Payer: Self-pay | Admitting: Family Medicine

## 2020-08-23 ENCOUNTER — Encounter: Payer: Self-pay | Admitting: Family Medicine

## 2020-08-23 ENCOUNTER — Other Ambulatory Visit: Payer: Self-pay

## 2020-08-23 ENCOUNTER — Ambulatory Visit (INDEPENDENT_AMBULATORY_CARE_PROVIDER_SITE_OTHER): Payer: 59 | Admitting: Family Medicine

## 2020-08-23 ENCOUNTER — Ambulatory Visit: Payer: Self-pay

## 2020-08-23 VITALS — BP 134/90 | HR 67 | Ht 65.0 in | Wt 146.0 lb

## 2020-08-23 DIAGNOSIS — M79672 Pain in left foot: Secondary | ICD-10-CM | POA: Diagnosis not present

## 2020-08-23 DIAGNOSIS — G8929 Other chronic pain: Secondary | ICD-10-CM | POA: Diagnosis not present

## 2020-08-23 DIAGNOSIS — M542 Cervicalgia: Secondary | ICD-10-CM

## 2020-08-23 DIAGNOSIS — M545 Low back pain, unspecified: Secondary | ICD-10-CM | POA: Diagnosis not present

## 2020-08-23 NOTE — Assessment & Plan Note (Signed)
Patient is doing much better at this time.  Still think it is secondary to multifactorial and he is making some improvement.  Encourage patient to continue to work on Engineer, building services.  Patient is doing very well overall.  I do believe that patient will continue to make improvement and follow-up with me again in 3 months

## 2020-08-23 NOTE — Patient Instructions (Signed)
Doing great overall Stand with heels shoulder and head on wall 2x a day See me again in 3 months if you need me

## 2020-08-23 NOTE — Progress Notes (Signed)
Kelly Harvey Phone: [phone] Subjective:   Fontaine No, am serving as a scribe for Dr. Hulan Saas.  This visit occurred during the SARS-CoV-2 public health emergency.  Safety protocols were in place, including screening questions prior to the visit, additional usage of staff PPE, and extensive cleaning of exam room while observing appropriate contact time as indicated for disinfecting solutions.    I'm seeing this patient by the request  of:  [name], MD  CC: Low back pain follow-up, left foot cyst  [id number]  Kelly Harvey is a 40 y.o. female coming in with complaint of patient was found to have multifactorial low back pain and neck pain.  Given home exercises.  Patient declined formal physical therapy initially.  Patient states that her back pain is better but still bothersome when standing for prolonged periods.   Pain in L foot is doing better. Feels that cyst has resolved. Was soaking foot in hot water for pain relief as well as massage.    Patient was also seen previously and did have a ganglion cyst of the left foot  Patient is accompanied with a translator again today.  Past Medical History:  Diagnosis Date   Allergy    H/O cesarean section complicating pregnancy [id number]   History of chicken pox    Newborn product of in vitro fertilization (IVF) pregnancy    Past Surgical History:  Procedure Laterality Date   CESAREAN SECTION N/A 07/20/2016   Procedure: CESAREAN SECTION;  Surgeon: Janyth Contes, MD;  Location: [facility];  Service: Obstetrics;  Laterality: N/A;   CESAREAN SECTION N/A 11/29/2017   Procedure: REPEAT CESAREAN SECTION;  Surgeon: Janyth Contes, MD;  Location: [facility];  Service: Obstetrics;  Laterality: N/A;  Kelly Harvey,  RNFA   Social History   Socioeconomic History   Marital status: Married    Spouse name: [name]   Number of  children: 2   Years of education: Not on file   Highest education level: Bachelor's degree (e.g., BA, AB, BS)  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  Tobacco Use   Smoking status: Never   Smokeless tobacco: Former  Scientific laboratory technician Use: Never used  Substance and Sexual Activity   Alcohol use: No   Drug use: No   Sexual activity: Yes  Other Topics Concern   Not on file  Social History Narrative   Lives with husband, 2 children   Social Determinants of Health   Financial Resource Strain: Not on file  Food Insecurity: Not on file  Transportation Needs: Not on file  Physical Activity: Not on file  Stress: Not on file  Social Connections: Not on file   No Known Allergies Family History  Problem Relation Age of Onset   Diabetes Mother    Hyperlipidemia Mother    Hypertension Mother    Miscarriages / [place] Mother    Hyperlipidemia Father    Hypertension Father    Healthy Brother    Healthy Son    Cancer Paternal Aunt        brain   Healthy Brother    Healthy Brother    Healthy Brother     Current Outpatient Medications (Endocrine & Metabolic):    metFORMIN (GLUCOPHAGE) 500 MG tablet, Take 2 tablets by mouth 2 (two) times daily.   predniSONE (DELTASONE) 10 MG tablet, 3 tabs x3 days and then 2 tabs x3 days and  then 1 tab x3 days.  Take w/ food.   SPRINTEC 28 0.25-35 MG-MCG tablet,     Current Outpatient Medications (Analgesics):    ibuprofen (ADVIL) 800 MG tablet, Take 800 mg by mouth 3 (three) times daily.   Current Outpatient Medications (Other):    Cholecalciferol (VITAMIN D3) 125 MCG (5000 UT) CAPS, Take by mouth.   metroNIDAZOLE (METROGEL) 0.75 % gel, Apply 1 application topically 2 (two) times daily.   Omega-3 Fatty Acids (OMEGA 3 PO), Take by mouth.   omeprazole (PRILOSEC) 40 MG capsule, TAKE 1 CAPSULE (40 MG TOTAL) BY MOUTH DAILY.   Reviewed prior external information including notes and imaging from  primary care provider As well as  notes that were available from care everywhere and other healthcare systems.  Past medical history, social, surgical and family history all reviewed in electronic medical record.  No pertanent information unless stated regarding to the chief complaint.   Review of Systems:  No headache, visual changes, nausea, vomiting, diarrhea, constipation, dizziness, abdominal pain, skin rash, fevers, chills, night sweats, weight loss, swollen lymph nodes, body aches, joint swelling, chest pain, shortness of breath, mood changes. POSITIVE muscle aches  Objective  Blood pressure 134/90, pulse 67, height '5\' 5"'$  (1.651 m), weight 146 lb (66.2 kg), SpO2 98 %.   General: No apparent distress alert and oriented x3 mood and affect normal, dressed appropriately.  HEENT: Pupils equal, extraocular movements intact  Respiratory: Patient's speak in full sentences and does not appear short of breath  Cardiovascular: No lower extremity edema, non tender, no erythema  Gait normal with good balance and coordination.  MSK: Neck exam does have some very mild loss of lordosis.  Very mild increase in kyphosis of the upper thoracic back.  Low back exam very mild tightness with FABER test.  Negative straight leg test.  Foot exam shows the patient cyst is smaller than previously.  Still kind of the midfoot bones noted.  Good range of motion of the ankle.  Neurovascular intact distally.  No overlying erythema  Limited muscular skeletal ultrasound was performed and interpreted by Hulan Saas, M  Limited musculoskeletal ultrasound shows the patient does have significant decrease in size of the hypoechoic mass noted on the foot.  No findings of vascularity in the area. Impression: Decreased size of likely ganglion cyst of the foot   Impression and Recommendations:     The above documentation has been reviewed and is accurate and complete [name], DO

## 2020-11-22 ENCOUNTER — Ambulatory Visit: Payer: 59 | Admitting: Family Medicine

## 2020-12-05 NOTE — Progress Notes (Signed)
Kelly Harvey Kelly Kelly Harvey [address] [place] Phone: [phone] Subjective:   IVilma Harvey, am serving as a scribe for Dr. Hulan Saas. This visit occurred during the SARS-CoV-2 public health emergency.  Safety protocols were in place, including screening questions prior to the visit, additional usage of staff PPE, and extensive cleaning of exam room while observing appropriate contact time as indicated for disinfecting solutions.   I'm seeing this patient by the request  of:  Kelly Minium, MD  CC: Low back pain  [id number]  08/23/2020 Patient is doing much better at this time.  Still think it is secondary to multifactorial and he is making some improvement.  Encourage patient to continue to work on Engineer, building services.  Patient is doing very well overall.  I do believe that patient will continue to make improvement and follow-up with me again in 3 months  Updated 12/06/2020 Kelly Harvey is a 40 y.o. female coming in with complaint of LBP. A couple of month ago the pain started. Sharp sometimes and it comes and goes. Left side is worse. No numbness or tingling this time around. Takes Tylenol, tart cherry, and hot water for pain. Only helps sometimes. Pain goes into hip. Pain also sometimes comes anteriorly into her abdomen. Neck pain located near trapezius. Patient is accompanied with a translator but does do well with [place].      Past Medical History:  Diagnosis Date   Allergy    H/O cesarean section complicating pregnancy 19/03/7900   History of chicken pox    Newborn product of in vitro fertilization (IVF) pregnancy    Past Surgical History:  Procedure Laterality Date   CESAREAN SECTION N/A 07/20/2016   Procedure: CESAREAN SECTION;  Surgeon: Kelly Contes, MD;  Location: [facility];  Service: Obstetrics;  Laterality: N/A;   CESAREAN SECTION N/A 11/29/2017   Procedure: REPEAT CESAREAN SECTION;  Surgeon:  Kelly Contes, MD;  Location: [facility];  Service: Obstetrics;  Laterality: N/A;  Kelly Harvey,  RNFA   Social History   Socioeconomic History   Marital status: Married    Spouse name: Kelly Harvey   Number of children: 2   Years of education: Not on file   Highest education level: Bachelor's degree (e.g., BA, AB, BS)  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  Tobacco Use   Smoking status: Never   Smokeless tobacco: Former  Scientific laboratory technician Use: Never used  Substance and Sexual Activity   Alcohol use: No   Drug use: No   Sexual activity: Yes  Other Topics Concern   Not on file  Social History Narrative   Lives with husband, 2 children   Social Determinants of Health   Financial Resource Strain: Not on file  Food Insecurity: Not on file  Transportation Needs: Not on file  Physical Activity: Not on file  Stress: Not on file  Social Connections: Not on file   No Known Allergies Family History  Problem Relation Age of Onset   Diabetes Mother    Hyperlipidemia Mother    Hypertension Mother    Miscarriages / [place] Mother    Hyperlipidemia Father    Hypertension Father    Healthy Brother    Healthy Son    Cancer Paternal Aunt        brain   Healthy Brother    Healthy Brother    Healthy Brother     Current Outpatient Medications (Endocrine & Metabolic):  metFORMIN (GLUCOPHAGE) 500 MG tablet, Take 2 tablets by mouth 2 (two) times daily.   predniSONE (DELTASONE) 10 MG tablet, 3 tabs x3 days and then 2 tabs x3 days and then 1 tab x3 days.  Take w/ food.   SPRINTEC 28 0.25-35 MG-MCG tablet,     Current Outpatient Medications (Analgesics):    ibuprofen (ADVIL) 800 MG tablet, Take 800 mg by mouth 3 (three) times daily.   Current Outpatient Medications (Other):    Cholecalciferol (VITAMIN D3) 125 MCG (5000 UT) CAPS, Take by mouth.   metroNIDAZOLE (METROGEL) 0.75 % gel, Apply 1 application topically 2 (two) times daily.   Omega-3 Fatty Acids  (OMEGA 3 PO), Take by mouth.   omeprazole (PRILOSEC) 40 MG capsule, TAKE 1 CAPSULE (40 MG TOTAL) BY MOUTH DAILY.   Reviewed prior external information including notes and imaging from  primary care provider As well as notes that were available from care everywhere and other healthcare systems.  Past medical history, social, surgical and family history all reviewed in electronic medical record.  No pertanent information unless stated regarding to the chief complaint.   Review of Systems:  No headache, visual changes, nausea, vomiting, diarrhea, constipation, dizziness, abdominal pain, skin rash, fevers, chills, night sweats, weight loss, swollen lymph nodes, joint swelling, chest pain, shortness of breath, mood changes. POSITIVE muscle aches, body aches  Objective  Blood pressure 120/80, pulse (!) 55, height 5\' 5"  (1.651 m), weight 148 lb (67.1 kg), SpO2 99 %.   General: No apparent distress alert and oriented x3 mood and affect normal, dressed appropriately.  HEENT: Pupils equal, extraocular movements intact  Respiratory: Patient's speak in full sentences and does not appear short of breath  Cardiovascular: No lower extremity edema, non tender, no erythema  Gait normal with good balance and coordination.  MSK: Neck exam does have some mild loss of lordosis.  Some tightness noted with full flexion.  Patient does not have any true scapular dyskinesis at the moment. Low back exam moderately tender over the left sacroiliac joint and mild positive Kelly Harvey on the left side. Left ankle exam does have a mild cyst formation noted on the anterior lateral aspect of the ankle.   Impression and Recommendations:     The above documentation has been reviewed and is accurate and complete [name], DO

## 2020-12-06 ENCOUNTER — Ambulatory Visit (INDEPENDENT_AMBULATORY_CARE_PROVIDER_SITE_OTHER): Payer: 59 | Admitting: Family Medicine

## 2020-12-06 ENCOUNTER — Other Ambulatory Visit: Payer: Self-pay

## 2020-12-06 DIAGNOSIS — M67479 Ganglion, unspecified ankle and foot: Secondary | ICD-10-CM

## 2020-12-06 DIAGNOSIS — G8929 Other chronic pain: Secondary | ICD-10-CM | POA: Diagnosis not present

## 2020-12-06 DIAGNOSIS — M545 Low back pain, unspecified: Secondary | ICD-10-CM | POA: Diagnosis not present

## 2020-12-06 DIAGNOSIS — M542 Cervicalgia: Secondary | ICD-10-CM

## 2020-12-06 NOTE — Assessment & Plan Note (Signed)
Multifactorial.  Is having worsening symptoms and needs to continue to work on stability exercises.  We will see me again in 6 weeks and do think we should consider the possibility of the osteopathic manipulation.

## 2020-12-06 NOTE — Patient Instructions (Signed)
Do prescribed exercises at least 3x a week Voltaren gel Ice We will watch cyst on foot Let check in 6 weeks and we will try manipulation

## 2020-12-06 NOTE — Assessment & Plan Note (Signed)
Mild exacerbation of the chronic problem.  Is having some mild worsening symptoms but has not been doing the exercises.  Given exercises again at this time.  Discussed icing regimen and home exercises.  We could potentially consider the possibility of anti-inflammatories but not taking things regularly.  We can also consider possible formal physical therapy and we can discuss again in 6 to 8 weeks.

## 2020-12-06 NOTE — Assessment & Plan Note (Signed)
Stable at the moment.  If any worsening would consider the possibility of aspiration under ultrasound guidance.

## 2021-01-01 ENCOUNTER — Encounter: Payer: Self-pay | Admitting: Family Medicine

## 2021-01-01 ENCOUNTER — Ambulatory Visit (INDEPENDENT_AMBULATORY_CARE_PROVIDER_SITE_OTHER): Payer: 59 | Admitting: Family Medicine

## 2021-01-01 ENCOUNTER — Other Ambulatory Visit (INDEPENDENT_AMBULATORY_CARE_PROVIDER_SITE_OTHER): Payer: 59

## 2021-01-01 VITALS — BP 112/74 | HR 60 | Temp 98.0°F | Resp 16 | Ht 65.0 in | Wt 146.2 lb

## 2021-01-01 DIAGNOSIS — E559 Vitamin D deficiency, unspecified: Secondary | ICD-10-CM

## 2021-01-01 DIAGNOSIS — E282 Polycystic ovarian syndrome: Secondary | ICD-10-CM

## 2021-01-01 DIAGNOSIS — Z23 Encounter for immunization: Secondary | ICD-10-CM

## 2021-01-01 DIAGNOSIS — D2230 Melanocytic nevi of unspecified part of face: Secondary | ICD-10-CM

## 2021-01-01 DIAGNOSIS — Z0000 Encounter for general adult medical examination without abnormal findings: Secondary | ICD-10-CM | POA: Diagnosis not present

## 2021-01-01 DIAGNOSIS — Z862 Personal history of diseases of the blood and blood-forming organs and certain disorders involving the immune mechanism: Secondary | ICD-10-CM | POA: Diagnosis not present

## 2021-01-01 LAB — CBC WITH DIFFERENTIAL/PLATELET
Basophils Absolute: 0.1 10*3/uL (ref 0.0–0.1)
Basophils Relative: 0.7 % (ref 0.0–3.0)
Eosinophils Absolute: 0.1 10*3/uL (ref 0.0–0.7)
Eosinophils Relative: 1.1 % (ref 0.0–5.0)
HCT: 35.8 % — ABNORMAL LOW (ref 36.0–46.0)
Hemoglobin: 11.6 g/dL — ABNORMAL LOW (ref 12.0–15.0)
Lymphocytes Relative: 38.7 % (ref 12.0–46.0)
Lymphs Abs: 2.8 10*3/uL (ref 0.7–4.0)
MCHC: 32.4 g/dL (ref 30.0–36.0)
MCV: 82.1 fl (ref 78.0–100.0)
Monocytes Absolute: 0.4 10*3/uL (ref 0.1–1.0)
Monocytes Relative: 6 % (ref 3.0–12.0)
Neutro Abs: 3.9 10*3/uL (ref 1.4–7.7)
Neutrophils Relative %: 53.5 % (ref 43.0–77.0)
Platelets: 326 10*3/uL (ref 150.0–400.0)
RBC: 4.36 Mil/uL (ref 3.87–5.11)
RDW: 15.8 % — ABNORMAL HIGH (ref 11.5–15.5)
WBC: 7.3 10*3/uL (ref 4.0–10.5)

## 2021-01-01 LAB — HEPATIC FUNCTION PANEL
ALT: 8 U/L (ref 0–35)
AST: 14 U/L (ref 0–37)
Albumin: 4.4 g/dL (ref 3.5–5.2)
Alkaline Phosphatase: 54 U/L (ref 39–117)
Bilirubin, Direct: 0.1 mg/dL (ref 0.0–0.3)
Total Bilirubin: 0.6 mg/dL (ref 0.2–1.2)
Total Protein: 7.9 g/dL (ref 6.0–8.3)

## 2021-01-01 LAB — BASIC METABOLIC PANEL
BUN: 15 mg/dL (ref 6–23)
CO2: 30 mEq/L (ref 19–32)
Calcium: 9.8 mg/dL (ref 8.4–10.5)
Chloride: 100 mEq/L (ref 96–112)
Creatinine, Ser: 0.66 mg/dL (ref 0.40–1.20)
GFR: 110.29 mL/min (ref >60.00)
Glucose, Bld: 80 mg/dL (ref 70–99)
Potassium: 3.7 mEq/L (ref 3.5–5.1)
Sodium: 137 mEq/L (ref 135–145)

## 2021-01-01 LAB — LIPID PANEL
Cholesterol: 179 mg/dL (ref 0–200)
HDL: 79.9 mg/dL (ref >39.00)
LDL Cholesterol: 84 mg/dL (ref 0–99)
NonHDL: 99.03
Total CHOL/HDL Ratio: 2
Triglycerides: 76 mg/dL (ref 0.0–149.0)
VLDL: 15.2 mg/dL (ref 0.0–40.0)

## 2021-01-01 LAB — HEMOGLOBIN A1C: Hgb A1c MFr Bld: 5.7 % (ref 4.6–6.5)

## 2021-01-01 LAB — VITAMIN D 25 HYDROXY (VIT D DEFICIENCY, FRACTURES): VITD: 92.35 ng/mL (ref 30.00–100.00)

## 2021-01-01 LAB — TSH: TSH: 3.55 u[IU]/mL (ref 0.35–5.50)

## 2021-01-01 MED ORDER — METFORMIN HCL 500 MG PO TABS: ORAL_TABLET | ORAL | 1 refills | Status: DC

## 2021-01-01 MED ORDER — FOLIC ACID 800 MCG PO TABS: 800.0000 ug | ORAL_TABLET | Freq: Every day | ORAL | 1 refills | Status: DC

## 2021-01-01 NOTE — Assessment & Plan Note (Signed)
Check labs and replete prn. 

## 2021-01-01 NOTE — Assessment & Plan Note (Signed)
Check CBC and iron panel.  Start iron if needed

## 2021-01-01 NOTE — Assessment & Plan Note (Signed)
Pt's PE WNL.  UTD on pap, mammo.  Flu shot given today.  Check labs.  Anticipatory guidance provided.

## 2021-01-01 NOTE — Patient Instructions (Addendum)
Follow up in 1 year or as needed Go to [facility] to get your labs done [facility] notify you of your lab results and make any changes if needed Continue to work on healthy diet and regular exercise- you look great! Call with any questions or concerns Stay Safe!  Stay Healthy! Happy Holidays!!

## 2021-01-01 NOTE — Assessment & Plan Note (Signed)
New to provider, ongoing for pt.  Reports she has been on Metformin since 2009.  Needs a refill.  Refill provided.

## 2021-01-01 NOTE — Progress Notes (Signed)
° °  Subjective:    Patient ID: Kelly Harvey, female    DOB: 1980-12-23, 40 y.o.   MRN: [id number]  HPI CPE- UTD on pap, mammo, Tdap.  Flu shot today.  No concerns today.  Patient Care Team    Relationship Specialty Notifications Start End  [name], MD PCP - General Family Medicine  01/27/17   Janyth Contes, MD Consulting Physician Obstetrics and Gynecology  12/12/19     Health Maintenance  Topic Date Due   Pneumococcal Vaccine 20-18 Years old (1 - PCV) Never done   INFLUENZA VACCINE  08/19/2020   PAP SMEAR-Modifier  01/19/2023 (Originally 08/28/2019)   TETANUS/TDAP  05/09/2026   Hepatitis C Screening  Completed   HIV Screening  Completed   HPV VACCINES  Aged Out      Review of Systems Patient reports no vision/ hearing changes, adenopathy,fever, weight change,  persistant/recurrent hoarseness , swallowing issues, chest pain, palpitations, edema, persistant/recurrent cough, hemoptysis, dyspnea (rest/exertional/paroxysmal nocturnal), gastrointestinal bleeding (melena, rectal bleeding), abdominal pain, significant heartburn, bowel changes, GU symptoms (dysuria, hematuria, incontinence), Gyn symptoms (abnormal  bleeding, pain),  syncope, focal weakness, memory loss, numbness & tingling, skin/hair/nail changes, abnormal bruising or bleeding, anxiety, or depression.   PCOS- pt reports she has been taking Metformin since 2009 and Dr Melba Coon has been prescribing.  She reports taking 1 tab w/ breakfast and 2 tabs w/ lunch.  She is asking for a refill today.  This visit occurred during the SARS-CoV-2 public health emergency.  Safety protocols were in place, including screening questions prior to the visit, additional usage of staff PPE, and extensive cleaning of exam room while observing appropriate contact time as indicated for disinfecting solutions.      Objective:   Physical Exam General Appearance:    Alert, cooperative, no distress, appears stated age  Head:     Normocephalic, without obvious abnormality, atraumatic  Eyes:    PERRL, conjunctiva/corneas clear, EOM's intact, fundi    benign, both eyes  Ears:    Normal TM's and external ear canals, both ears  Nose:   Deferred due to COVID  Throat:   Neck:   Supple, symmetrical, trachea midline, no adenopathy;    Thyroid: no enlargement/tenderness/nodules  Back:     Symmetric, no curvature, ROM normal, no CVA tenderness  Lungs:     Clear to auscultation bilaterally, respirations unlabored  Chest Wall:    No tenderness or deformity   Heart:    Regular rate and rhythm, S1 and S2 normal, no murmur, rub   or gallop  Breast Exam:    Deferred to GYN  Abdomen:     Soft, non-tender, bowel sounds active all four quadrants,    no masses, no organomegaly  Genitalia:    Deferred to GYN  Rectal:    Extremities:   Extremities normal, atraumatic, no cyanosis or edema  Pulses:   2+ and symmetric all extremities  Skin:   Skin color, texture, turgor normal, no rashes or lesions  Lymph nodes:   Cervical, supraclavicular, and axillary nodes normal  Neurologic:   CNII-XII intact, normal strength, sensation and reflexes    throughout          Assessment & Plan:

## 2021-01-02 LAB — IRON,TIBC AND FERRITIN PANEL
%SAT: 10 % (calc) — ABNORMAL LOW (ref 16–45)
Ferritin: 5 ng/mL — ABNORMAL LOW (ref 16–154)
Iron: 45 ug/dL (ref 40–190)
TIBC: 435 mcg/dL (calc) (ref 250–450)

## 2021-01-03 ENCOUNTER — Encounter: Payer: Self-pay | Admitting: Family Medicine

## 2021-01-23 NOTE — Progress Notes (Deleted)
[place] [facility] [place] Phone: [phone] Subjective:    I'm seeing this patient by the request  of:  [name], MD  CC:   [id number]  12/06/2020 Multifactorial.  Is having worsening symptoms and needs to continue to work on stability exercises.  We will see me again in 6 weeks and do think we should consider the possibility of the osteopathic manipulation. Mild exacerbation of the chronic problem.  Is having some mild worsening symptoms but has not been doing the exercises.  Given exercises again at this time.  Discussed icing regimen and home exercises.  We could potentially consider the possibility of anti-inflammatories but not taking things regularly.  We can also consider possible formal physical therapy and we can discuss again in 6 to 8 weeks.  Updated 01/24/2021 Kelly Harvey is a 41 y.o. female coming in with complaint of neck and back pain. Possible OMT       Past Medical History:  Diagnosis Date   Allergy    H/O cesarean section complicating pregnancy 99/03/5699   History of chicken pox    Newborn product of in vitro fertilization (IVF) pregnancy    Past Surgical History:  Procedure Laterality Date   CESAREAN SECTION N/A 07/20/2016   Procedure: CESAREAN SECTION;  Surgeon: Janyth Contes, MD;  Location: [facility];  Service: Obstetrics;  Laterality: N/A;   CESAREAN SECTION N/A 11/29/2017   Procedure: REPEAT CESAREAN SECTION;  Surgeon: Janyth Contes, MD;  Location: [facility];  Service: Obstetrics;  Laterality: N/A;  [name],  RNFA   Social History   Socioeconomic History   Marital status: Married    Spouse name: [name]   Number of children: 2   Years of education: Not on file   Highest education level: Bachelor's degree (e.g., BA, AB, BS)  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  Tobacco Use   Smoking status: Never   Smokeless tobacco: Former  Brewing technologist Use: Never used  Substance and Sexual Activity   Alcohol use: No   Drug use: No   Sexual activity: Yes  Other Topics Concern   Not on file  Social History Narrative   Lives with husband, 2 children   Social Determinants of Health   Financial Resource Strain: Not on file  Food Insecurity: Not on file  Transportation Needs: Not on file  Physical Activity: Not on file  Stress: Not on file  Social Connections: Not on file   No Known Allergies Family History  Problem Relation Age of Onset   Diabetes Mother    Hyperlipidemia Mother    Hypertension Mother    Miscarriages / Stillbirths Mother    Hyperlipidemia Father    Hypertension Father    Healthy Brother    Healthy Son    Cancer Paternal Aunt        brain   Healthy Brother    Healthy Brother    Healthy Brother     Current Outpatient Medications (Endocrine & Metabolic):    metFORMIN (GLUCOPHAGE) 500 MG tablet, 1 tab with breakfast and 2 tabs with lunch daily    Current Outpatient Medications (Analgesics):    ibuprofen (ADVIL) 800 MG tablet, Take 800 mg by mouth 3 (three) times daily.  Current Outpatient Medications (Hematological):    folic acid (FOLVITE) 779 MCG tablet, Take 1 tablet (800 mcg total) by mouth daily.  Current Outpatient Medications (Other):    Cholecalciferol (VITAMIN  D3) 125 MCG (5000 UT) CAPS, Take by mouth.   Omega-3 Fatty Acids (OMEGA 3 PO), Take by mouth.   Zinc Acetate, Oral, (ZINC ACETATE PO), Take by mouth.   Reviewed prior external information including notes and imaging from  primary care provider As well as notes that were available from care everywhere and other healthcare systems.  Past medical history, social, surgical and family history all reviewed in electronic medical record.  No pertanent information unless stated regarding to the chief complaint.   Review of Systems:  No headache, visual changes, nausea, vomiting, diarrhea, constipation, dizziness, abdominal pain, skin  rash, fevers, chills, night sweats, weight loss, swollen lymph nodes, body aches, joint swelling, chest pain, shortness of breath, mood changes. POSITIVE muscle aches  Objective  There were no vitals taken for this visit.   General: No apparent distress alert and oriented x3 mood and affect normal, dressed appropriately.  HEENT: Pupils equal, extraocular movements intact  Respiratory: Patient's speak in full sentences and does not appear short of breath  Cardiovascular: No lower extremity edema, non tender, no erythema  Gait normal with good balance and coordination.  MSK:  Non tender with full range of motion and good stability and symmetric strength and tone of shoulders, elbows, wrist, hip, knee and ankles bilaterally.     Impression and Recommendations:     The above documentation has been reviewed and is accurate and complete [name]

## 2021-01-24 ENCOUNTER — Ambulatory Visit: Payer: Managed Care, Other (non HMO) | Admitting: Family Medicine

## 2021-03-11 ENCOUNTER — Telehealth: Payer: Self-pay

## 2021-03-11 MED ORDER — METFORMIN HCL 500 MG PO TABS: ORAL_TABLET | ORAL | 1 refills | Status: AC

## 2021-03-11 NOTE — Telephone Encounter (Signed)
Encourage patient to contact the pharmacy for refills or they can request refills through [facility]  (Please schedule appointment if patient has not been seen in over a year)    WHAT PHARMACY WOULD THEY LIKE THIS SENT TO: [id number] - [place], [place], [place] [address]   MEDICATION NAME & DOSE:metFORMIN (GLUCOPHAGE) 500 MG tablet   NOTES/COMMENTS FROM PATIENT:Pt is traveling [place] for two months leaving March 13 and returning May 27th and will need medication for this long      [place] office please notify patient: It takes 48-72 hours to process rx refill requests Ask patient to call pharmacy to ensure rx is ready before heading there.

## 2021-03-11 NOTE — Telephone Encounter (Signed)
Sent in 3 months supply with note to pharmacy that it is okay to fill early due to travel

## 2021-03-19 ENCOUNTER — Other Ambulatory Visit: Payer: Self-pay | Admitting: Family Medicine

## 2021-03-19 DIAGNOSIS — Z1231 Encounter for screening mammogram for malignant neoplasm of breast: Secondary | ICD-10-CM

## 2021-07-07 ENCOUNTER — Ambulatory Visit: Payer: Managed Care, Other (non HMO)

## 2022-01-02 ENCOUNTER — Encounter: Payer: Self-pay | Admitting: Family Medicine

## 2022-01-02 ENCOUNTER — Ambulatory Visit (INDEPENDENT_AMBULATORY_CARE_PROVIDER_SITE_OTHER): Payer: Commercial Managed Care - HMO | Admitting: Family Medicine

## 2022-01-02 VITALS — BP 128/70 | HR 63 | Temp 98.5°F | Resp 17 | Ht 65.0 in | Wt 150.0 lb

## 2022-01-02 DIAGNOSIS — Z0000 Encounter for general adult medical examination without abnormal findings: Secondary | ICD-10-CM | POA: Diagnosis not present

## 2022-01-02 DIAGNOSIS — E559 Vitamin D deficiency, unspecified: Secondary | ICD-10-CM | POA: Diagnosis not present

## 2022-01-02 DIAGNOSIS — E282 Polycystic ovarian syndrome: Secondary | ICD-10-CM | POA: Diagnosis not present

## 2022-01-02 DIAGNOSIS — Z23 Encounter for immunization: Secondary | ICD-10-CM

## 2022-01-02 LAB — CBC WITH DIFFERENTIAL/PLATELET
Basophils Absolute: 0 10*3/uL (ref 0.0–0.1)
Basophils Relative: 0.3 % (ref 0.0–3.0)
Eosinophils Absolute: 0.1 10*3/uL (ref 0.0–0.7)
Eosinophils Relative: 1.1 % (ref 0.0–5.0)
HCT: 35.8 % — ABNORMAL LOW (ref 36.0–46.0)
Hemoglobin: 12.1 g/dL (ref 12.0–15.0)
Lymphocytes Relative: 26.2 % (ref 12.0–46.0)
Lymphs Abs: 2.6 10*3/uL (ref 0.7–4.0)
MCHC: 33.8 g/dL (ref 30.0–36.0)
MCV: 87.3 fl (ref 78.0–100.0)
Monocytes Absolute: 0.7 10*3/uL (ref 0.1–1.0)
Monocytes Relative: 7.1 % (ref 3.0–12.0)
Neutro Abs: 6.6 10*3/uL (ref 1.4–7.7)
Neutrophils Relative %: 65.3 % (ref 43.0–77.0)
Platelets: 283 10*3/uL (ref 150.0–400.0)
RBC: 4.1 Mil/uL (ref 3.87–5.11)
RDW: 14.3 % (ref 11.5–15.5)
WBC: 10.1 10*3/uL (ref 4.0–10.5)

## 2022-01-02 LAB — VITAMIN D 25 HYDROXY (VIT D DEFICIENCY, FRACTURES): VITD: 69.02 ng/mL (ref 30.00–100.00)

## 2022-01-02 LAB — BASIC METABOLIC PANEL
BUN: 12 mg/dL (ref 6–23)
CO2: 31 mEq/L (ref 19–32)
Calcium: 9.2 mg/dL (ref 8.4–10.5)
Chloride: 100 mEq/L (ref 96–112)
Creatinine, Ser: 0.66 mg/dL (ref 0.40–1.20)
GFR: 109.51 mL/min (ref >60.00)
Glucose, Bld: 76 mg/dL (ref 70–99)
Potassium: 3.9 mEq/L (ref 3.5–5.1)
Sodium: 138 mEq/L (ref 135–145)

## 2022-01-02 LAB — HEPATIC FUNCTION PANEL
ALT: 10 U/L (ref 0–35)
AST: 15 U/L (ref 0–37)
Albumin: 4.2 g/dL (ref 3.5–5.2)
Alkaline Phosphatase: 83 U/L (ref 39–117)
Bilirubin, Direct: 0.1 mg/dL (ref 0.0–0.3)
Total Bilirubin: 0.4 mg/dL (ref 0.2–1.2)
Total Protein: 7.3 g/dL (ref 6.0–8.3)

## 2022-01-02 LAB — LIPID PANEL
Cholesterol: 171 mg/dL (ref 0–200)
HDL: 75.3 mg/dL (ref >39.00)
LDL Cholesterol: 83 mg/dL (ref 0–99)
NonHDL: 95.41
Total CHOL/HDL Ratio: 2
Triglycerides: 63 mg/dL (ref 0.0–149.0)
VLDL: 12.6 mg/dL (ref 0.0–40.0)

## 2022-01-02 LAB — TSH: TSH: 2.47 u[IU]/mL (ref 0.35–5.50)

## 2022-01-02 IMAGING — DX DG CERVICAL SPINE COMPLETE 4+V
5 series · 5 of 5 positions shown · non-contrast
Comparison: None.

CLINICAL DATA: Neck pain. Patient reports left-sided cervical neck
pain for 1 month.

EXAM:
CERVICAL SPINE - COMPLETE 4+ VIEW

[c-spine lat]
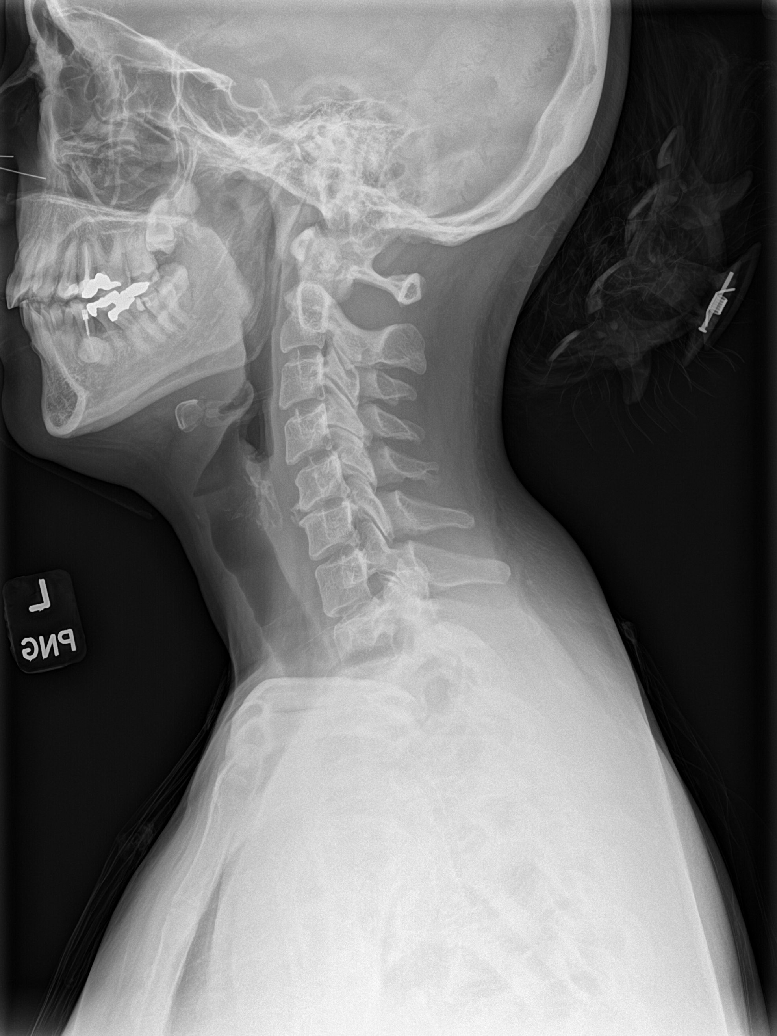

[c-spine obl (1 of 2)]
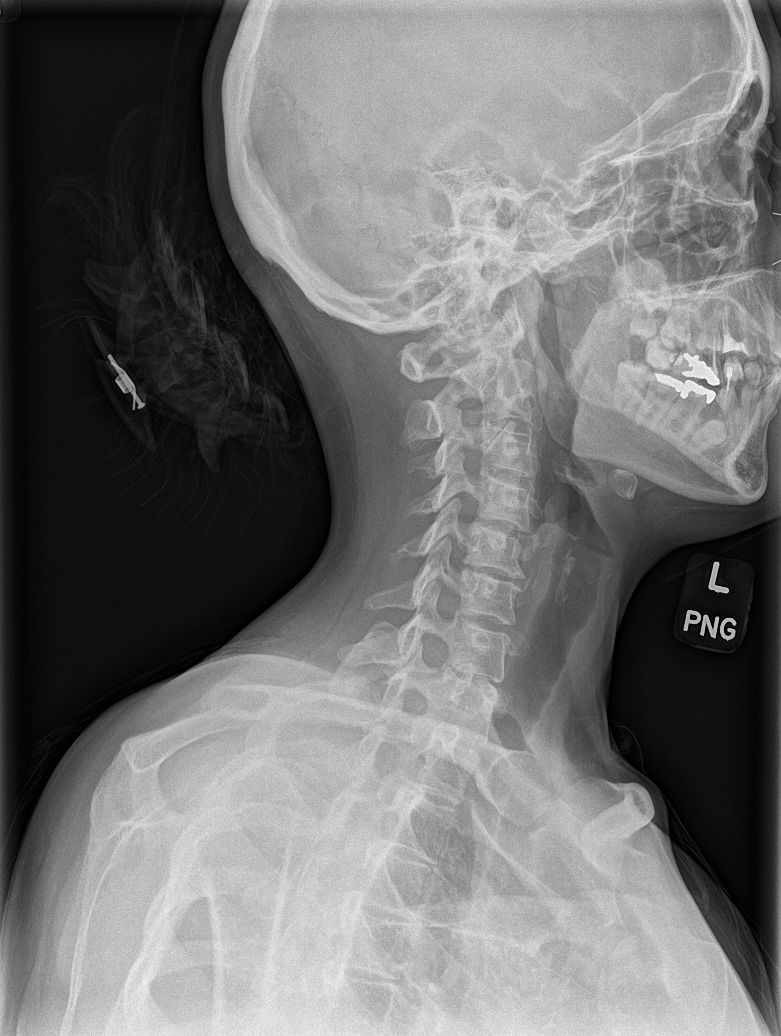

[c-spine obl (2 of 2)]
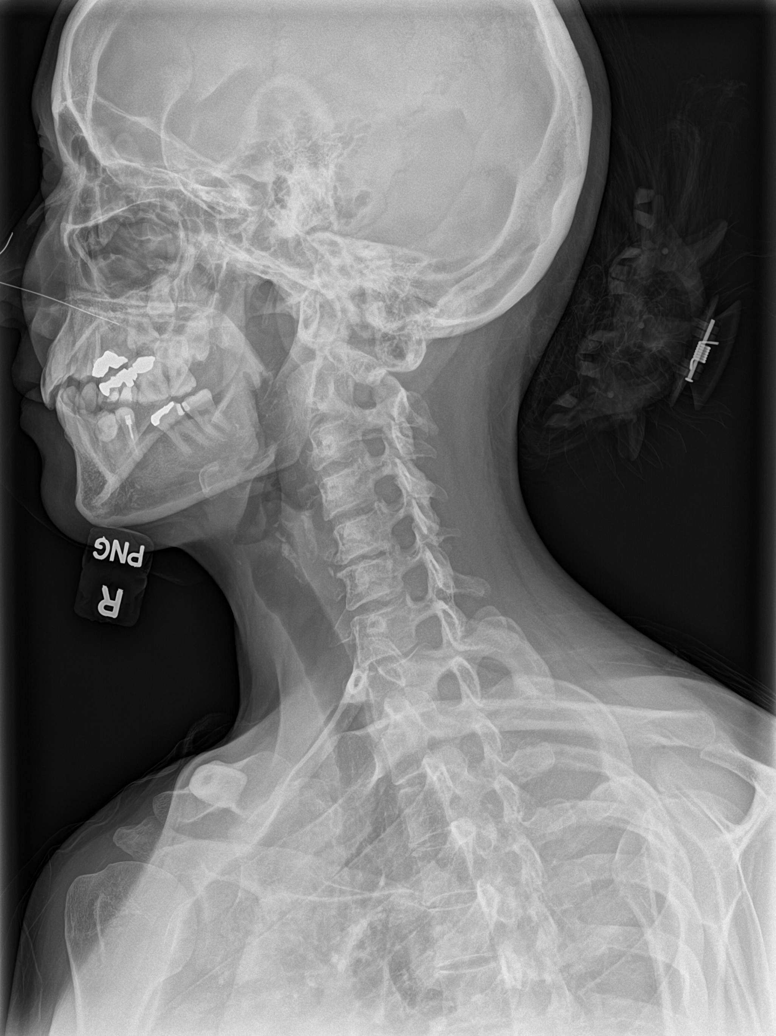

[c-spine ap]
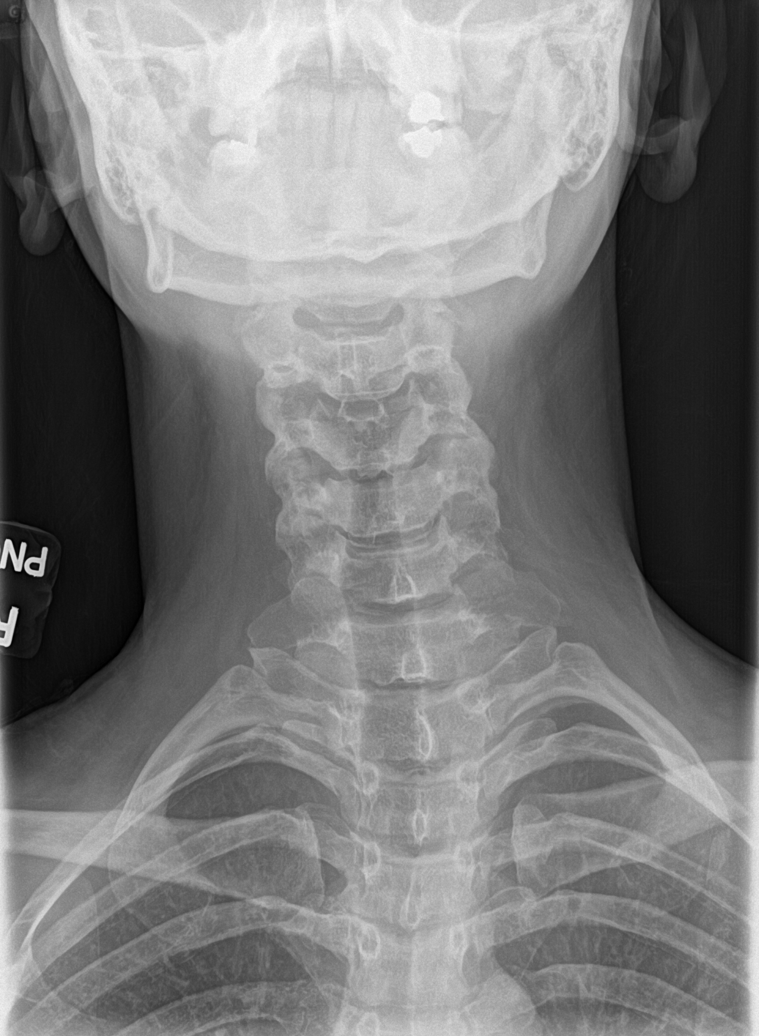

[c-spine open mouth]
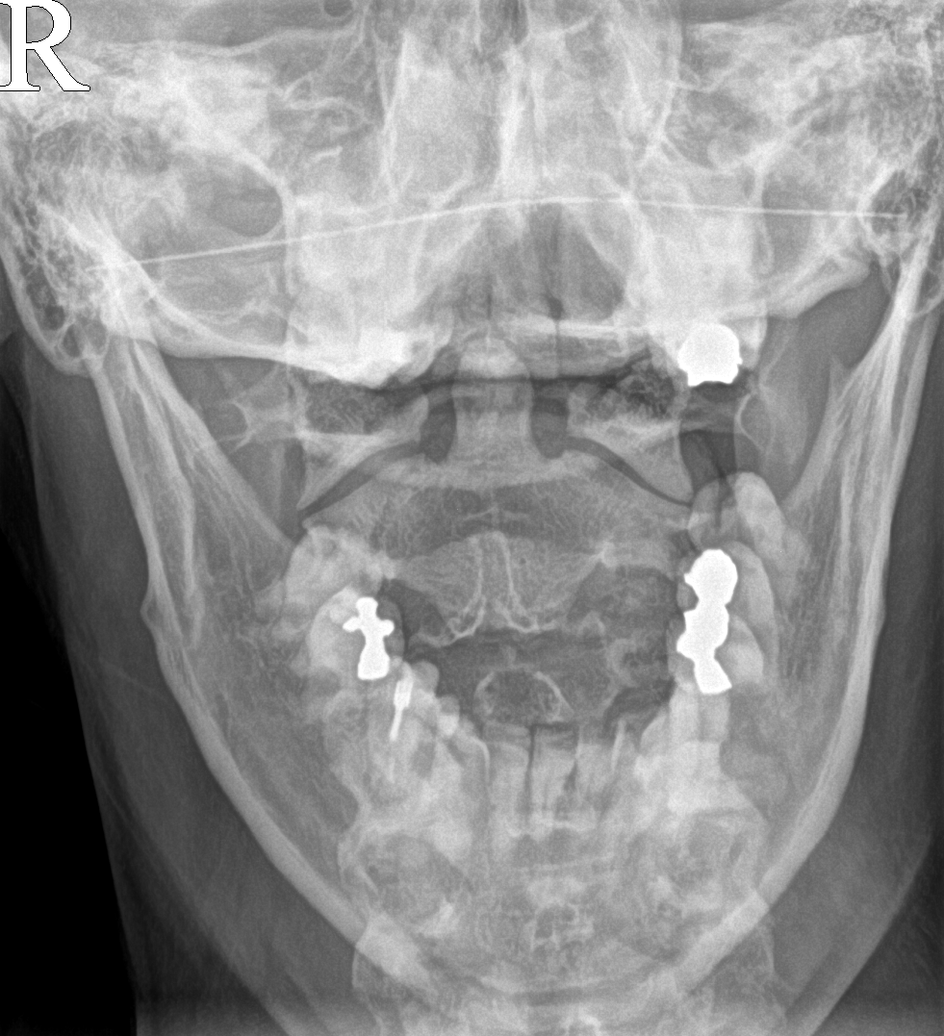

[5 of 5 positions shown; findings below may reference images not displayed]

FINDINGS: Normal alignment. Vertebral body heights are normal. There is disc
space narrowing at C5-C6 with mild endplate spurring. Remaining disc
spaces are preserved. No bony neural foraminal narrowing. No
fracture, evidence of focal bone lesion or bone destruction. There
is no prevertebral soft tissue edema. Lateral masses of C1 well
aligned on C2.
IMPRESSION: Mild degenerative disc disease at C5-C6. Otherwise negative cervical
spine radiographs.

## 2022-01-02 MED ORDER — CETIRIZINE HCL 10 MG PO TABS: 10.0000 mg | ORAL_TABLET | Freq: Every day | ORAL | 11 refills | Status: AC

## 2022-01-02 NOTE — Assessment & Plan Note (Signed)
Check labs and replete prn. 

## 2022-01-02 NOTE — Assessment & Plan Note (Signed)
Pt's PE WNL.  UTD on Tdap.  GYN visit 08/21/21.  Flu shot given today.  Check labs.  Anticipatory guidance provided.

## 2022-01-02 NOTE — Assessment & Plan Note (Signed)
Currently on Metformin.  Check labs to risk stratify for possible insulin resistance or hyperlipidemia.

## 2022-01-02 NOTE — Patient Instructions (Signed)
Follow up in 1 year or as needed We'll notify you of your lab results and make any changes if needed Keep up the good work on healthy diet and regular exercise- you look great! START the Cetirizine (Zyrtec) daily to help w/ drainage and congestion Drink LOTS of fluids Take ibuprofen for sore throat Call with any questions or concerns Stay Safe!  Stay Healthy! Have a great weekend!!

## 2022-01-02 NOTE — Progress Notes (Signed)
   Subjective:    Patient ID: Kelly Harvey, female    DOB: [birth year], 41 y.o.   MRN: [id number]  HPI CPE- UTD on Tdap, will do flu today.  UTD on GYN- last visit 08/21/21  Patient Care Team    Relationship Specialty Notifications Start End  [name], MD PCP - General Family Medicine  01/27/17   Janyth Contes, MD Consulting Physician Obstetrics and Gynecology  12/12/19     Health Maintenance  Topic Date Due   INFLUENZA VACCINE  08/19/2021   PAP SMEAR-Modifier  01/19/2023 (Originally 08/28/2019)   DTaP/Tdap/Td (3 - Td or Tdap) 09/11/2027   Hepatitis C Screening  Completed   HIV Screening  Completed   HPV VACCINES  Aged Out   COVID-19 Vaccine  Discontinued      Review of Systems Patient reports no vision/ hearing changes, adenopathy,fever, weight change,  persistant/recurrent hoarseness , swallowing issues, chest pain, palpitations, edema, persistant/recurrent cough, hemoptysis, dyspnea (rest/exertional/paroxysmal nocturnal), gastrointestinal bleeding (melena, rectal bleeding), abdominal pain, significant heartburn, bowel changes, GU symptoms (dysuria, hematuria, incontinence), Gyn symptoms (abnormal  bleeding, pain),  syncope, focal weakness, memory loss, numbness & tingling, skin/hair/nail changes, abnormal bruising or bleeding, anxiety, or depression.     Objective:   Physical Exam General Appearance:    Alert, cooperative, no distress, appears stated age  Head:    Normocephalic, without obvious abnormality, atraumatic  Eyes:    PERRL, conjunctiva/corneas clear, EOM's intact both eyes  Ears:    Normal TM's and external ear canals, both ears  Nose:   Nares normal, septum midline, mucosa normal, no drainage    or sinus tenderness  Throat:   Lips, mucosa, and tongue normal; teeth and gums normal  Neck:   Supple, symmetrical, trachea midline, no adenopathy;    Thyroid: no enlargement/tenderness/nodules  Back:     Symmetric, no curvature, ROM normal, no CVA tenderness   Lungs:     Clear to auscultation bilaterally, respirations unlabored  Chest Wall:    No tenderness or deformity   Heart:    Regular rate and rhythm, S1 and S2 normal, no murmur, rub   or gallop  Breast Exam:    Deferred to GYN  Abdomen:     Soft, non-tender, bowel sounds active all four quadrants,    no masses, no organomegaly  Genitalia:    Deferred to GYN  Rectal:    Extremities:   Extremities normal, atraumatic, no cyanosis or edema  Pulses:   2+ and symmetric all extremities  Skin:   Skin color, texture, turgor normal, no rashes or lesions  Lymph nodes:   Cervical, supraclavicular, and axillary nodes normal  Neurologic:   CNII-XII intact, normal strength, sensation and reflexes    throughout          Assessment & Plan:

## 2022-01-05 NOTE — Progress Notes (Signed)
Left vm stating lab results

## 2022-02-09 ENCOUNTER — Telehealth: Payer: Self-pay | Admitting: Family Medicine

## 2022-02-09 NOTE — Telephone Encounter (Signed)
Please advise if okay?

## 2022-02-09 NOTE — Telephone Encounter (Signed)
Pt husband called wife taking Metformin 500 Mg. They'll be traveling over seas and would like a 4 mth supply. Pt is also taking Progesterone as well prescribed by OB-GYN and would Dr Birdie Riddle to fill it as well.

## 2022-02-09 NOTE — Telephone Encounter (Signed)
Pt husband informed of providers instructions

## 2022-02-09 NOTE — Telephone Encounter (Signed)
We can update the quantity of her Metformin to reflect 4 months (#360) but they will need to ask GYN to prescribe the progesterone as this doesn't come from me and is not currently on her med list.  This is something that they should continue to fill
